# Patient Record
Sex: Female | Born: 1980 | Hispanic: Yes | Marital: Single | Smoking: Never smoker
Health system: Southern US, Community
[De-identification: ages and names within clinical notes are randomized; demographics above are authoritative.]

## PROBLEM LIST (undated history)

## (undated) ENCOUNTER — Inpatient Hospital Stay (HOSPITAL_COMMUNITY): Payer: Self-pay

## (undated) DIAGNOSIS — O24419 Gestational diabetes mellitus in pregnancy, unspecified control: Secondary | ICD-10-CM

## (undated) DIAGNOSIS — O0990 Supervision of high risk pregnancy, unspecified, unspecified trimester: Secondary | ICD-10-CM

## (undated) HISTORY — DX: Gestational diabetes mellitus in pregnancy, unspecified control: O24.419

## (undated) HISTORY — DX: Supervision of high risk pregnancy, unspecified, unspecified trimester: O09.90

---

## 2017-03-27 LAB — OB RESULTS CONSOLE VARICELLA ZOSTER ANTIBODY, IGG: Varicella: IMMUNE

## 2017-03-27 LAB — GLUCOSE TOLERANCE, 1 HOUR: GLUCOSE 1 HOUR: 92

## 2017-03-27 LAB — OB RESULTS CONSOLE HGB/HCT, BLOOD
HCT: 41
HEMOGLOBIN: 12.9

## 2017-03-27 LAB — OB RESULTS CONSOLE ABO/RH: RH Type: POSITIVE

## 2017-03-27 LAB — CULTURE, OB URINE: URINE CULTURE, OB: NEGATIVE

## 2017-03-27 LAB — OB RESULTS CONSOLE PLATELET COUNT: Platelets: 269

## 2017-03-27 LAB — CYTOLOGY - PAP: PAP SMEAR: NEGATIVE

## 2017-03-27 LAB — OB RESULTS CONSOLE ANTIBODY SCREEN: ANTIBODY SCREEN: NEGATIVE

## 2017-03-27 LAB — OB RESULTS CONSOLE RPR: RPR: NONREACTIVE

## 2017-04-12 ENCOUNTER — Encounter: Payer: Self-pay | Admitting: *Deleted

## 2017-04-18 ENCOUNTER — Encounter: Payer: Medicaid Other | Attending: Family Medicine | Admitting: *Deleted

## 2017-04-18 ENCOUNTER — Ambulatory Visit: Payer: Self-pay | Admitting: *Deleted

## 2017-04-18 DIAGNOSIS — R7302 Impaired glucose tolerance (oral): Secondary | ICD-10-CM | POA: Insufficient documentation

## 2017-04-18 DIAGNOSIS — R7309 Other abnormal glucose: Secondary | ICD-10-CM

## 2017-04-18 NOTE — Progress Notes (Signed)
  Patient was seen on 04/18/2017 for Gestational Diabetes self-management . She is here with Spanish interpretor.  Patient states history of GDM with last pregnancy a little over one year ago. She also reports she has 6 children, this is her 66th. Diet history obtained, she is eating 2 meals and 1 snack due to some nausea during the day. Beverages include coffee, water and fruit juices. She arrived 20 minutes late for this appointment so we will reschedule to cover the nutrition portion of the visit. The following learning objectives were met by the patient :   States the definition of Gestational Diabetes  States when to check blood glucose levels  Demonstrates proper blood glucose monitoring techniques  States the effect of stress and exercise on blood glucose levels  Understands that fruit juice has significant impact on BG and to avoid during pregnancy  Plan to cover at next visit:  States why dietary management is important in controlling blood glucose  Describes the effects of carbohydrates on blood glucose levels  Demonstrates ability to create a balanced meal plan  Demonstrates carbohydrate counting    States the importance of limiting caffeine and abstaining from alcohol and smoking  Plan:  Consider  increasing your activity level by walking or other activity daily as tolerated Begin checking BG before breakfast and 2 hours after first bite of breakfast, lunch and dinner as directed by MD  Bring Log Book to every medical appointment   Take medication if directed by MD  Blood glucose monitor given: [employer] Track Lot # [id number] Exp: 04/13/2019 Blood glucose reading: 110 mg/dl FASTING  Patient instructed to monitor glucose levels: FBS: 60 - 95 mg/dl 2 hour: <120 mg/dl  Patient received the following handouts:Spanish  Nutrition Diabetes and Pregnancy  Carbohydrate Counting List  Patient will be seen for follow-up in 1 week to complete education on GDM

## 2017-04-27 ENCOUNTER — Ambulatory Visit: Payer: Self-pay | Admitting: *Deleted

## 2017-04-27 ENCOUNTER — Ambulatory Visit (INDEPENDENT_AMBULATORY_CARE_PROVIDER_SITE_OTHER): Payer: Medicaid Other | Admitting: Family Medicine

## 2017-04-27 ENCOUNTER — Encounter: Payer: Medicaid Other | Attending: Family Medicine | Admitting: *Deleted

## 2017-04-27 ENCOUNTER — Encounter: Payer: Self-pay | Admitting: Family Medicine

## 2017-04-27 VITALS — BP 123/70 | HR 71 | Ht 63.0 in | Wt 170.3 lb

## 2017-04-27 DIAGNOSIS — O0990 Supervision of high risk pregnancy, unspecified, unspecified trimester: Secondary | ICD-10-CM

## 2017-04-27 DIAGNOSIS — Z713 Dietary counseling and surveillance: Secondary | ICD-10-CM | POA: Diagnosis present

## 2017-04-27 DIAGNOSIS — R7301 Impaired fasting glucose: Secondary | ICD-10-CM

## 2017-04-27 DIAGNOSIS — O24419 Gestational diabetes mellitus in pregnancy, unspecified control: Secondary | ICD-10-CM

## 2017-04-27 DIAGNOSIS — O09521 Supervision of elderly multigravida, first trimester: Secondary | ICD-10-CM | POA: Diagnosis not present

## 2017-04-27 DIAGNOSIS — O24410 Gestational diabetes mellitus in pregnancy, diet controlled: Secondary | ICD-10-CM | POA: Insufficient documentation

## 2017-04-27 DIAGNOSIS — O09529 Supervision of elderly multigravida, unspecified trimester: Secondary | ICD-10-CM

## 2017-04-27 DIAGNOSIS — R7302 Impaired glucose tolerance (oral): Secondary | ICD-10-CM | POA: Insufficient documentation

## 2017-04-27 DIAGNOSIS — O0991 Supervision of high risk pregnancy, unspecified, first trimester: Secondary | ICD-10-CM

## 2017-04-27 DIAGNOSIS — R7309 Other abnormal glucose: Secondary | ICD-10-CM

## 2017-04-27 HISTORY — DX: Gestational diabetes mellitus in pregnancy, unspecified control: O24.419

## 2017-04-27 HISTORY — DX: Supervision of high risk pregnancy, unspecified, unspecified trimester: O09.90

## 2017-04-27 LAB — POCT URINALYSIS DIP (DEVICE)
Bilirubin Urine: NEGATIVE
GLUCOSE, UA: NEGATIVE mg/dL
Hgb urine dipstick: NEGATIVE
KETONES UR: NEGATIVE mg/dL
Leukocytes, UA: NEGATIVE
Nitrite: NEGATIVE
PH: 7 (ref 5.0–8.0)
PROTEIN: NEGATIVE mg/dL
Specific Gravity, Urine: 1.015 (ref 1.005–1.030)
UROBILINOGEN UA: 0.2 mg/dL (ref 0.0–1.0)

## 2017-04-27 NOTE — Progress Notes (Signed)
  Patient was seen on 04/27/2017 for Gestational Diabetes self-management follow up visit . We used [employer] as the Spanish interpretor today. We did not have time to complete nutrition education at last visit so plan to cover that today.  Diet history obtained, she is now eating 3 meals and 2 snack due to some nausea during the day. Beverages include coffee and water . The following learning objectives were met by the patient :   States why dietary management is important in controlling blood glucose  Describes the effects of carbohydrates on blood glucose levels  Demonstrates ability to create a balanced meal plan  Demonstrates carbohydrate counting    States the importance of limiting caffeine and abstaining from alcohol and smoking  Plan:  Aim for 2-3 Carb Choices per meal, 1-2 per snack Continue checking BG before breakfast and 2 hours after first bite of breakfast, lunch and dinner as directed by MD  Bring Log Book to every medical appointment   Take medication if directed by MD  Blood glucose Log Book shows FBG all below 95 mg/dl for past week and 2 hour post meal BG all below 120 mg/dl  Patient instructed to monitor glucose levels: FBS: 60 - 95 mg/dl 2 hour: <120 mg/dl  Patient received the following handouts:Spanish  Nutrition Diabetes and Pregnancy  Carbohydrate Counting List  Patient will be seen for follow-up as needed

## 2017-04-27 NOTE — Progress Notes (Signed)
Subjective:  Rebecca Booker is a 36 y.o. G7P6006 at 3557w4d being seen today for initial prenatal care. [facility], although her 1hr GTT was 8192. She did have GDM with her last pregnancy, but was diet controlled. Additionally, HD records state that she had 2 SGA babies, although her baby in 2015 was the only SGA baby at 5#3oz at 40wks. She is currently monitored for the following issues for this high-risk pregnancy and has Supervision of high risk pregnancy, antepartum and Impaired fasting glucose on their problem list.  GDM: Patient diet controlled.  Reports no hypoglycemic episodes.  Tolerating medication well Fasting: 85-118 (5 > 90) 2hr PP: 76-138 (4 elevated)  Patient reports occasional nausea and vomiting.  Contractions: Not present. Vag. Bleeding: None.   . Denies leaking of fluid.   The following portions of the patient's history were reviewed and updated as appropriate: allergies, current medications, past family history, past medical history, past social history, past surgical history and problem list. Problem list updated.  Objective:   Vitals:   04/27/17 1534 04/27/17 1536  BP: 123/70   Pulse: 71   Weight: 170 lb 4.8 oz (77.2 kg)   Height:  5\' 3"  (1.6 m)    Fetal Status:           General:  Alert, oriented and cooperative. Patient is in no acute distress.  Skin: Skin is warm and dry. No rash noted.   Cardiovascular: Normal heart rate noted  Respiratory: Normal respiratory effort, no problems with respiration noted  Abdomen: Soft, gravid, appropriate for gestational age. Pain/Pressure: Absent     Pelvic: Vag. Bleeding: None     Cervical exam deferred        Extremities: Normal range of motion.  Edema: None  Mental Status: Normal mood and affect. Normal behavior. Normal judgment and thought content.   Urinalysis: Urine Protein: Negative Urine Glucose: Negative  Assessment and Plan:  Pregnancy: G7P6006 at 7757w4d  1.  Supervision of high risk pregnancy, antepartum FHT and FH normal. Detailed US  2. Impaired fasting glucose Although her 1hr GTT was normal, she had several abnormal values on her fasting and PP checks. Will continue with CBGs and will get HgA1c. - Hemoglobin A1c  3. AMA (advanced maternal age) multigravida 35+, unspecified trimester Offer down syndrome screening - pt declined.  Preterm labor symptoms and general obstetric precautions including but not limited to vaginal bleeding, contractions, leaking of fluid and fetal movement were reviewed in detail with the patient. Please refer to After Visit Summary for other counseling recommendations.  No Follow-up on file.   Levie HeritageStinson, Marbeth Smedley J, DO

## 2017-04-27 NOTE — Progress Notes (Signed)
Spanish Interpreter Marly Adams Flu vaccine today

## 2017-04-28 LAB — HEMOGLOBIN A1C
Est. average glucose Bld gHb Est-mCnc: 108 mg/dL
Hgb A1c MFr Bld: 5.4 % (ref 4.8–5.6)

## 2017-05-25 ENCOUNTER — Encounter: Payer: Self-pay | Admitting: Obstetrics and Gynecology

## 2017-05-30 NOTE — L&D Delivery Note (Addendum)
Patient: Rebecca Booker MRN: [id number]  GBS status: Positive, Ampicillin   Patient is a 37 y.o. now G7P7 s/p NSVD at [redacted]w[redacted]d, who was admitted for SOL. SROM 0h 43m prior to delivery with clear fluid.   Delivery Note At 11:29 PM a viable female was delivered via Vaginal, Spontaneous (Presentation: ROA;  vertex).  APGAR: 9, 9  Placenta status: intact, sent to L&D.  Cord: 3-vessel  with no complications  Anesthesia:  None Episiotomy: None Lacerations: None Suture Repair: None Est. Blood Loss (mL): 50  Mom to postpartum.  Baby to Couplet care / Skin to Skin.  Head delivered ROA. No nuchal cord present. Shoulder and body delivered in usual fashion. Infant with spontaneous cry, placed on mother's abdomen, dried and bulb suctioned. Cord clamped x 2 after 1-minute delay, and cut by RN. Cord blood drawn. Placenta delivered spontaneously with gentle cord traction. Fundus firm with massage and Pitocin. Perineum inspected and found to have no lacerations.  Patient with good hemostasis and minimal blood loss.      Alfonso Ellis 10/21/2017, 11:43 PM

## 2017-06-14 ENCOUNTER — Encounter (HOSPITAL_COMMUNITY): Payer: Self-pay

## 2017-06-14 ENCOUNTER — Inpatient Hospital Stay (HOSPITAL_COMMUNITY)
Admission: AD | Admit: 2017-06-14 | Discharge: 2017-06-14 | Disposition: A | Discharge status: Home / Self Care | Payer: No Typology Code available for payment source | Source: Ambulatory Visit | Attending: Obstetrics and Gynecology | Admitting: Obstetrics and Gynecology

## 2017-06-14 DIAGNOSIS — Z3A20 20 weeks gestation of pregnancy: Secondary | ICD-10-CM | POA: Diagnosis not present

## 2017-06-14 DIAGNOSIS — O09529 Supervision of elderly multigravida, unspecified trimester: Secondary | ICD-10-CM

## 2017-06-14 DIAGNOSIS — Z363 Encounter for antenatal screening for malformations: Secondary | ICD-10-CM

## 2017-06-14 DIAGNOSIS — O26893 Other specified pregnancy related conditions, third trimester: Secondary | ICD-10-CM | POA: Diagnosis present

## 2017-06-14 DIAGNOSIS — M25519 Pain in unspecified shoulder: Secondary | ICD-10-CM | POA: Insufficient documentation

## 2017-06-14 DIAGNOSIS — R079 Chest pain, unspecified: Secondary | ICD-10-CM | POA: Diagnosis not present

## 2017-06-14 DIAGNOSIS — O09522 Supervision of elderly multigravida, second trimester: Secondary | ICD-10-CM | POA: Insufficient documentation

## 2017-06-14 DIAGNOSIS — O0990 Supervision of high risk pregnancy, unspecified, unspecified trimester: Secondary | ICD-10-CM

## 2017-06-14 DIAGNOSIS — O9A212 Injury, poisoning and certain other consequences of external causes complicating pregnancy, second trimester: Secondary | ICD-10-CM

## 2017-06-14 DIAGNOSIS — Y92410 Unspecified street and highway as the place of occurrence of the external cause: Secondary | ICD-10-CM | POA: Diagnosis not present

## 2017-06-14 DIAGNOSIS — O24410 Gestational diabetes mellitus in pregnancy, diet controlled: Secondary | ICD-10-CM | POA: Diagnosis not present

## 2017-06-14 DIAGNOSIS — Z3A21 21 weeks gestation of pregnancy: Secondary | ICD-10-CM

## 2017-06-14 DIAGNOSIS — Z1389 Encounter for screening for other disorder: Secondary | ICD-10-CM

## 2017-06-14 LAB — URINALYSIS, ROUTINE W REFLEX MICROSCOPIC
BILIRUBIN URINE: NEGATIVE
GLUCOSE, UA: NEGATIVE mg/dL
HGB URINE DIPSTICK: NEGATIVE
KETONES UR: NEGATIVE mg/dL
Leukocytes, UA: NEGATIVE
Nitrite: NEGATIVE
PH: 6 (ref 5.0–8.0)
Protein, ur: NEGATIVE mg/dL
Specific Gravity, Urine: 1.01 (ref 1.005–1.030)
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
## 2017-06-22 ENCOUNTER — Ambulatory Visit (HOSPITAL_COMMUNITY)
Admit: 2017-06-22 | Discharge: 2017-06-22 | Disposition: A | Discharge status: Home / Self Care | Payer: Self-pay | Attending: Student | Admitting: Student

## 2017-06-22 ENCOUNTER — Ambulatory Visit (HOSPITAL_COMMUNITY)
Admit: 2017-06-22 | Discharge: 2017-06-22 | Disposition: A | Discharge status: Home / Self Care | Payer: Self-pay | Source: Ambulatory Visit | Attending: Student | Admitting: Student

## 2017-06-22 ENCOUNTER — Ambulatory Visit (INDEPENDENT_AMBULATORY_CARE_PROVIDER_SITE_OTHER): Payer: Self-pay | Admitting: Family Medicine

## 2017-06-22 ENCOUNTER — Encounter (HOSPITAL_COMMUNITY): Payer: Self-pay

## 2017-06-22 VITALS — BP 116/61 | HR 84 | Wt 174.3 lb

## 2017-06-22 DIAGNOSIS — Z3A21 21 weeks gestation of pregnancy: Secondary | ICD-10-CM | POA: Insufficient documentation

## 2017-06-22 DIAGNOSIS — O09522 Supervision of elderly multigravida, second trimester: Secondary | ICD-10-CM | POA: Insufficient documentation

## 2017-06-22 DIAGNOSIS — O09529 Supervision of elderly multigravida, unspecified trimester: Secondary | ICD-10-CM

## 2017-06-22 DIAGNOSIS — O0990 Supervision of high risk pregnancy, unspecified, unspecified trimester: Secondary | ICD-10-CM

## 2017-06-22 DIAGNOSIS — Z363 Encounter for antenatal screening for malformations: Secondary | ICD-10-CM | POA: Insufficient documentation

## 2017-06-22 DIAGNOSIS — O24410 Gestational diabetes mellitus in pregnancy, diet controlled: Secondary | ICD-10-CM | POA: Insufficient documentation

## 2017-06-22 DIAGNOSIS — O0992 Supervision of high risk pregnancy, unspecified, second trimester: Secondary | ICD-10-CM | POA: Insufficient documentation

## 2017-06-22 LAB — POCT URINALYSIS DIP (DEVICE)
Bilirubin Urine: NEGATIVE
GLUCOSE, UA: NEGATIVE mg/dL
Hgb urine dipstick: NEGATIVE
KETONES UR: NEGATIVE mg/dL
Leukocytes, UA: NEGATIVE
Nitrite: NEGATIVE
Protein, ur: NEGATIVE mg/dL
SPECIFIC GRAVITY, URINE: 1.015 (ref 1.005–1.030)
Urobilinogen, UA: 0.2 mg/dL (ref 0.0–1.0)
pH: 7 (ref 5.0–8.0)

## 2017-06-22 NOTE — Progress Notes (Signed)
Subjective:  Rebecca Booker is a 37 y.o. G7P6006 at 4861w4d being seen today for ongoing prenatal care.  She is currently monitored for the following issues for this high-risk pregnancy and has Supervision of high risk pregnancy, antepartum; GDM, class A2; and AMA (advanced maternal age) multigravida 35+, unspecified trimester on their problem list.  GDM: Patient diet controlled. Ran out of strips, so not checking after every meal. Also checking some before meals.  Fasting: 76-100 (3 elevated over 2 weeks) 2hr PP: 93-135 (4-5 elevated)  Patient reports no complaints.  Contractions: Not present. Vag. Bleeding: None.  Movement: Present. Denies leaking of fluid.   The following portions of the patient's history were reviewed and updated as appropriate: allergies, current medications, past family history, past medical history, past social history, past surgical history and problem list. Problem list updated.  Objective:   Vitals:   06/22/17 1325  BP: 116/61  Pulse: 84  Weight: 174 lb 4.8 oz (79.1 kg)    Fetal Status: Fetal Heart Rate (bpm): 150   Movement: Present     General:  Alert, oriented and cooperative. Patient is in no acute distress.  Skin: Skin is warm and dry. No rash noted.   Cardiovascular: Normal heart rate noted  Respiratory: Normal respiratory effort, no problems with respiration noted  Abdomen: Soft, gravid, appropriate for gestational age. Pain/Pressure: Absent     Pelvic: Vag. Bleeding: None     Cervical exam deferred        Extremities: Normal range of motion.  Edema: Trace  Mental Status: Normal mood and affect. Normal behavior. Normal judgment and thought content.   Urinalysis:      Assessment and Plan:  Pregnancy: G7P6006 at 3261w4d  1. Supervision of high risk pregnancy, antepartum FHT and FH normal  2. AMA (advanced maternal age) multigravida 35+, unspecified trimester  3. GDM A1 Reemphasized taking CBGs fasting and 2hrs PP. Discussed compliance  with carb modified diet.  Preterm labor symptoms and general obstetric precautions including but not limited to vaginal bleeding, contractions, leaking of fluid and fetal movement were reviewed in detail with the patient. Please refer to After Visit Summary for other counseling recommendations.  No Follow-up on file.   Levie HeritageStinson, Braheem Tomasik J, DO

## 2017-06-22 NOTE — Progress Notes (Signed)
Contacted US to inform of tardiness.

## 2017-06-23 DIAGNOSIS — Z3A21 21 weeks gestation of pregnancy: Secondary | ICD-10-CM | POA: Insufficient documentation

## 2017-06-23 NOTE — Progress Notes (Signed)
Genetic Counseling  High-Risk Gestation Note  Appointment Date:  06/23/2017 Referred By: Judeth Horn, NP Date of Birth:  [birth year]   Pregnancy History: [id number] Estimated Date of Delivery: 10/29/17 Estimated Gestational Age: [redacted]w[redacted]d Attending: Particia Nearing, MD   Ms. Rebecca Booker was seen for genetic counseling because of a maternal age of 37 y.o.. She will be 37 years old at delivery. [facility] English/Spanish interpreter, Rebecca Booker, provided interpretation for today's visit.      In summary:  Discussed AMA and associated risk for fetal aneuploidy  Discussed options for screening  NIPS- patient wants to discuss cost with her husband and if desires to pursue, will plan for lab draw on 07/20/17  Ultrasound- performed today, see separate report  Discussed diagnostic testing options  Amniocentesis- declined  Reviewed family history concerns  She was counseled regarding maternal age and the association with risk for chromosome conditions due to nondisjunction with aging of the ova.   We reviewed chromosomes, nondisjunction, and the associated 1 in 29 risk for fetal aneuploidy at [redacted]w[redacted]d gestation related to a maternal age of 37 y.o. at delivery.  She was counseled that the risk for aneuploidy decreases as gestational age increases, accounting for those pregnancies which spontaneously abort.  We specifically discussed Down syndrome (trisomy 54), trisomies 44 and 71, and sex chromosome aneuploidies (47,XXX and 47,XXY) including the common features and prognoses of each.   We reviewed available screening options including noninvasive prenatal screening (NIPS)/cell free DNA (cfDNA) screening and detailed ultrasound.  She was counseled that screening tests are used to modify a patient's a priori risk for aneuploidy, typically based on age. This estimate provides a pregnancy specific risk assessment. We reviewed the benefits and limitations of each option. Specifically, we discussed the  conditions for which each test screens, the detection rates, and false positive rates of each. She was also counseled regarding diagnostic testing via amniocentesis. We reviewed the approximate 1 in 300-500 risk for complications from amniocentesis, including spontaneous pregnancy loss. We discussed the possible results that the tests might provide including: positive, negative, unanticipated, and no result. Finally, they were counseled regarding the cost of each option and potential out of pocket expenses. The patient is self-pay.   She expressed interest in NIPS. We specifically discussed the self-pay price and the option to apply for compassionate care through [facility] laboratory that performs Panorama, and the potential associated reduction of cost. The patient declined pursuing NIPS today and plans to discuss this option further with her husband given the out of pocket cost. If she elects to pursue NIPS, she will plan for lab draw in our office to coincide with the date of her next OB visit. Lab draw for NIPS is scheduled for 07/20/17. The patient understands that this lab draw can be cancelled if she elects not to pursue NIPS. She declined amniocentesis given the associated risk for complications.     A complete ultrasound was performed today. The ultrasound report will be sent under separate cover. There were no visualized fetal anomalies or markers suggestive of aneuploidy. She understands that screening tests cannot rule out all birth defects or genetic syndromes. The patient was advised of this limitation and states she still does not want additional testing at this time.   Ms. Rebecca Booker was provided with written information regarding cystic fibrosis (CF), spinal muscular atrophy (SMA) and hemoglobinopathies including the carrier frequency, availability of carrier screening and prenatal diagnosis if indicated.  In addition, we discussed that CF and hemoglobinopathies  are routinely screened  for as part of the [place] newborn screening panel.  After further discussion, she elected to pursue / declined screening for CF, SMA and hemoglobinopathies.  Detailed family histories were not able to be constructed and reviewed today given time constraints due to the patient arriving late to today's visits. She reported that there was no known history of birth defects, intellectual disability, or known genetic conditions for herself of the father of the pregnancy. Consanguinity was denied. Without further information regarding the provided family history, an accurate genetic risk cannot be calculated. Further genetic counseling is warranted if more information is obtained.  Ms. Rebecca Booker denied exposure to environmental toxins or chemical agents. She denied the use of alcohol, tobacco or street drugs. She denied significant viral illnesses during the course of her pregnancy.   I counseled Ms. Rebecca ClassAna Guzman Booker regarding the above risks and available options.  The approximate face-to-face time with the genetic counselor was 30 minutes.  Quinn PlowmanKaren Rishi Vicario, MS,  Patent attorneyCertified Genetic Counselor

## 2017-07-20 ENCOUNTER — Ambulatory Visit (HOSPITAL_COMMUNITY): Payer: No Typology Code available for payment source

## 2017-07-20 ENCOUNTER — Ambulatory Visit (INDEPENDENT_AMBULATORY_CARE_PROVIDER_SITE_OTHER): Payer: Self-pay | Admitting: Obstetrics & Gynecology

## 2017-07-20 VITALS — BP 122/75 | HR 84 | Wt 177.1 lb

## 2017-07-20 DIAGNOSIS — O09529 Supervision of elderly multigravida, unspecified trimester: Secondary | ICD-10-CM

## 2017-07-20 DIAGNOSIS — O0990 Supervision of high risk pregnancy, unspecified, unspecified trimester: Secondary | ICD-10-CM

## 2017-07-20 DIAGNOSIS — O24419 Gestational diabetes mellitus in pregnancy, unspecified control: Secondary | ICD-10-CM

## 2017-07-20 NOTE — Progress Notes (Signed)
   PRENATAL VISIT NOTE  Subjective:  Rebecca Booker is a 37 y.o. G7P6006 at 5148w4d being seen today for ongoing prenatal care.  She is currently monitored for the following issues for this high-risk pregnancy and has Supervision of high risk pregnancy, antepartum; GDM, class A2; and AMA (advanced maternal age) multigravida 35+, unspecified trimester on their problem list.  Patient reports no complaints.  Contractions: Not present. Vag. Bleeding: None.  Movement: Present. Denies leaking of fluid.   The following portions of the patient's history were reviewed and updated as appropriate: allergies, current medications, past family history, past medical history, past social history, past surgical history and problem list. Problem list updated.  Objective:   Vitals:   07/20/17 1352  BP: 122/75  Pulse: 84  Weight: 177 lb 1.6 oz (80.3 kg)    Fetal Status: Fetal Heart Rate (bpm): 135   Movement: Present     General:  Alert, oriented and cooperative. Patient is in no acute distress.  Skin: Skin is warm and dry. No rash noted.   Cardiovascular: Normal heart rate noted  Respiratory: Normal respiratory effort, no problems with respiration noted  Abdomen: Soft, gravid, appropriate for gestational age.  Pain/Pressure: Absent     Pelvic: Cervical exam deferred        Extremities: Normal range of motion.  Edema: None  Mental Status:  Normal mood and affect. Normal behavior. Normal judgment and thought content.   Assessment and Plan:  Pregnancy: G7P6006 at 5448w4d  1. AMA (advanced maternal age) multigravida 35+, unspecified trimester Does not want Panorama or any genetic testing.    2. GDM, class A2 3 elevated fastings the next week. A couple of elevated post prandials.  Pt will decrease tortillas.  Will see in 2 weeks and see how CBGs are going.  MFM growth in 3 weeks.  3. Supervision of high risk pregnancy, antepartum RN Zachery DauerBarnes will abstract the prenatal records from health dept and  fill in box on problem list.     Preterm labor symptoms and general obstetric precautions including but not limited to vaginal bleeding, contractions, leaking of fluid and fetal movement were reviewed in detail with the patient. Please refer to After Visit Summary for other counseling recommendations.   RTC 2 weeks  Elsie LincolnKelly Jayen Bromwell, MD

## 2017-07-21 LAB — POCT URINALYSIS DIP (DEVICE)
Bilirubin Urine: NEGATIVE
Glucose, UA: NEGATIVE mg/dL
Hgb urine dipstick: NEGATIVE
Ketones, ur: NEGATIVE mg/dL
Leukocytes, UA: NEGATIVE
NITRITE: NEGATIVE
PH: 7 (ref 5.0–8.0)
PROTEIN: NEGATIVE mg/dL
Specific Gravity, Urine: 1.01 (ref 1.005–1.030)
UROBILINOGEN UA: 0.2 mg/dL (ref 0.0–1.0)

## 2017-08-03 ENCOUNTER — Ambulatory Visit (INDEPENDENT_AMBULATORY_CARE_PROVIDER_SITE_OTHER): Payer: Self-pay | Admitting: Obstetrics and Gynecology

## 2017-08-03 ENCOUNTER — Encounter: Payer: Self-pay | Admitting: Obstetrics and Gynecology

## 2017-08-03 VITALS — BP 116/58 | HR 79 | Wt 177.5 lb

## 2017-08-03 DIAGNOSIS — O24419 Gestational diabetes mellitus in pregnancy, unspecified control: Secondary | ICD-10-CM

## 2017-08-03 DIAGNOSIS — O09529 Supervision of elderly multigravida, unspecified trimester: Secondary | ICD-10-CM

## 2017-08-03 DIAGNOSIS — O0990 Supervision of high risk pregnancy, unspecified, unspecified trimester: Secondary | ICD-10-CM

## 2017-08-03 LAB — POCT URINALYSIS DIP (DEVICE)
BILIRUBIN URINE: NEGATIVE
Glucose, UA: NEGATIVE mg/dL
HGB URINE DIPSTICK: NEGATIVE
Ketones, ur: NEGATIVE mg/dL
Nitrite: NEGATIVE
Protein, ur: NEGATIVE mg/dL
Specific Gravity, Urine: 1.02 (ref 1.005–1.030)
UROBILINOGEN UA: 0.2 mg/dL (ref 0.0–1.0)
pH: 6.5 (ref 5.0–8.0)

## 2017-08-03 MED ORDER — PHENYLEPHRINE-MINERAL OIL-PET 0.25-14-74.9 % RE OINT: 1.0000 "application " | TOPICAL_OINTMENT | Freq: Two times a day (BID) | RECTAL | 3 refills | Status: DC | PRN

## 2017-08-03 NOTE — Progress Notes (Signed)
Pt c/o external itching around the anus

## 2017-08-03 NOTE — Progress Notes (Signed)
   PRENATAL VISIT NOTE  Subjective:  Rebecca Booker is a 37 y.o. G7P6006 at 5336w4d being seen today for ongoing prenatal care.  She is currently monitored for the following issues for this high-risk pregnancy and has Supervision of high risk pregnancy, antepartum; GDM, class A2; and AMA (advanced maternal age) multigravida 35+, unspecified trimester on their problem list.  Patient reports pruritic hemorrhoids which she treated with vinager.  Contractions: Not present. Vag. Bleeding: None.  Movement: Absent. Denies leaking of fluid.   The following portions of the patient's history were reviewed and updated as appropriate: allergies, current medications, past family history, past medical history, past social history, past surgical history and problem list. Problem list updated.  Objective:   Vitals:   08/03/17 1347  BP: (!) 116/58  Pulse: 79  Weight: 177 lb 8 oz (80.5 kg)    Fetal Status: Fetal Heart Rate (bpm): 145 Fundal Height: 28 cm Movement: Absent     General:  Alert, oriented and cooperative. Patient is in no acute distress.  Skin: Skin is warm and dry. No rash noted.   Cardiovascular: Normal heart rate noted  Respiratory: Normal respiratory effort, no problems with respiration noted  Abdomen: Soft, gravid, appropriate for gestational age.  Pain/Pressure: Absent     Pelvic: Cervical exam deferred      Small non tender hemorrhoid approximately 5 mm  Extremities: Normal range of motion.     Mental Status:  Normal mood and affect. Normal behavior. Normal judgment and thought content.   Assessment and Plan:  Pregnancy: G7P6006 at 3436w4d  1. Supervision of high risk pregnancy, antepartum Patient is doing well Rx preparation H provided Third trimester labs today - CBC - HIV antibody - RPR  2. GDM, class A2 CBGs reviewed and postprandial within range. 50 % of fasting within range. Patient states that she drinks a cup of coffee with sugar in the morning before fasting  values are obtained. Values range 84-110. Educated patient of the pertinence of fasting CBG. Also advised patient to consume protein rich snacks in between meal Will continue with diet control. Discussed starting insulin if no improvement  3. AMA (advanced maternal age) multigravida 35+, unspecified trimester Declined testing  Preterm labor symptoms and general obstetric precautions including but not limited to vaginal bleeding, contractions, leaking of fluid and fetal movement were reviewed in detail with the patient. Please refer to After Visit Summary for other counseling recommendations.  No Follow-up on file.   Catalina AntiguaPeggy Celisa Schoenberg, MD

## 2017-08-04 LAB — CBC
Hematocrit: 32.7 % — ABNORMAL LOW (ref 34.0–46.6)
Hemoglobin: 10.7 g/dL — ABNORMAL LOW (ref 11.1–15.9)
MCH: 27.9 pg (ref 26.6–33.0)
MCHC: 32.7 g/dL (ref 31.5–35.7)
MCV: 85 fL (ref 79–97)
PLATELETS: 215 10*3/uL (ref 150–379)
RBC: 3.84 x10E6/uL (ref 3.77–5.28)
RDW: 14.9 % (ref 12.3–15.4)
WBC: 9.6 10*3/uL (ref 3.4–10.8)

## 2017-08-04 LAB — HIV ANTIBODY (ROUTINE TESTING W REFLEX): HIV SCREEN 4TH GENERATION: NONREACTIVE

## 2017-08-04 LAB — RPR: RPR Ser Ql: NONREACTIVE

## 2017-08-16 ENCOUNTER — Other Ambulatory Visit: Payer: Self-pay | Admitting: Obstetrics & Gynecology

## 2017-08-16 ENCOUNTER — Encounter: Payer: Self-pay | Admitting: Obstetrics & Gynecology

## 2017-08-16 ENCOUNTER — Ambulatory Visit (HOSPITAL_COMMUNITY)
Admission: RE | Admit: 2017-08-16 | Discharge: 2017-08-16 | Disposition: A | Discharge status: Home / Self Care | Payer: Self-pay | Source: Ambulatory Visit | Attending: Obstetrics & Gynecology | Admitting: Obstetrics & Gynecology

## 2017-08-16 ENCOUNTER — Ambulatory Visit (INDEPENDENT_AMBULATORY_CARE_PROVIDER_SITE_OTHER): Payer: Self-pay | Admitting: Obstetrics & Gynecology

## 2017-08-16 VITALS — BP 115/62 | HR 89 | Wt 177.3 lb

## 2017-08-16 DIAGNOSIS — O322XX0 Maternal care for transverse and oblique lie, not applicable or unspecified: Secondary | ICD-10-CM | POA: Insufficient documentation

## 2017-08-16 DIAGNOSIS — O09522 Supervision of elderly multigravida, second trimester: Secondary | ICD-10-CM

## 2017-08-16 DIAGNOSIS — Z3A29 29 weeks gestation of pregnancy: Secondary | ICD-10-CM | POA: Insufficient documentation

## 2017-08-16 DIAGNOSIS — Z362 Encounter for other antenatal screening follow-up: Secondary | ICD-10-CM

## 2017-08-16 DIAGNOSIS — O0990 Supervision of high risk pregnancy, unspecified, unspecified trimester: Secondary | ICD-10-CM

## 2017-08-16 DIAGNOSIS — O0993 Supervision of high risk pregnancy, unspecified, third trimester: Secondary | ICD-10-CM

## 2017-08-16 DIAGNOSIS — O24419 Gestational diabetes mellitus in pregnancy, unspecified control: Secondary | ICD-10-CM

## 2017-08-16 DIAGNOSIS — O09299 Supervision of pregnancy with other poor reproductive or obstetric history, unspecified trimester: Secondary | ICD-10-CM

## 2017-08-16 DIAGNOSIS — O09523 Supervision of elderly multigravida, third trimester: Secondary | ICD-10-CM

## 2017-08-16 DIAGNOSIS — Z23 Encounter for immunization: Secondary | ICD-10-CM

## 2017-08-16 DIAGNOSIS — O09529 Supervision of elderly multigravida, unspecified trimester: Secondary | ICD-10-CM

## 2017-08-16 DIAGNOSIS — O09293 Supervision of pregnancy with other poor reproductive or obstetric history, third trimester: Secondary | ICD-10-CM | POA: Insufficient documentation

## 2017-08-16 LAB — POCT URINALYSIS DIP (DEVICE)
Bilirubin Urine: NEGATIVE
GLUCOSE, UA: NEGATIVE mg/dL
Hgb urine dipstick: NEGATIVE
Ketones, ur: NEGATIVE mg/dL
LEUKOCYTES UA: NEGATIVE
NITRITE: NEGATIVE
PROTEIN: NEGATIVE mg/dL
Specific Gravity, Urine: 1.015 (ref 1.005–1.030)
Urobilinogen, UA: 0.2 mg/dL (ref 0.0–1.0)
pH: 6.5 (ref 5.0–8.0)

## 2017-08-16 NOTE — Patient Instructions (Addendum)

## 2017-08-16 NOTE — Progress Notes (Signed)
   PRENATAL VISIT NOTE  Subjective:  Rebecca Booker is a 37 y.o. G7P6006 at 5363w3d being seen today for ongoing prenatal care.  She is currently monitored for the following issues for this high-risk pregnancy and has Supervision of high risk pregnancy, antepartum; GDM, class A2; and AMA (advanced maternal age) multigravida 35+, unspecified trimester on their problem list.  Patient reports no complaints.  Contractions: Not present. Vag. Bleeding: None.  Movement: Present. Denies leaking of fluid.   The following portions of the patient's history were reviewed and updated as appropriate: allergies, current medications, past family history, past medical history, past social history, past surgical history and problem list. Problem list updated.  Objective:   Vitals:   08/16/17 1337  BP: 115/62  Pulse: 89  Weight: 177 lb 4.8 oz (80.4 kg)    Fetal Status: Fetal Heart Rate (bpm): 143 Fundal Height: 32 cm Movement: Present     General:  Alert, oriented and cooperative. Patient is in no acute distress.  Skin: Skin is warm and dry. No rash noted.   Cardiovascular: Normal heart rate noted  Respiratory: Normal respiratory effort, no problems with respiration noted  Abdomen: Soft, gravid, appropriate for gestational age.  Pain/Pressure: Present     Pelvic: Cervical exam deferred        Extremities: Normal range of motion.  Edema: None  Mental Status:  Normal mood and affect. Normal behavior. Normal judgment and thought content.   Assessment and Plan:  Pregnancy: G7P6006 at 1363w3d  1. Supervision of high risk pregnancy, antepartum  - Flu Vaccine QUAD 36+ mos IM  2. GDM, class A2  - Flu Vaccine QUAD 36+ mos IM  3. AMA (advanced maternal age) multigravida 35+, unspecified trimester  - Flu Vaccine QUAD 36+ mos IM She is not sure all FBS are truly fasting, most are above 95 and majority of PP are in range.  Preterm labor symptoms and general obstetric precautions including but not  limited to vaginal bleeding, contractions, leaking of fluid and fetal movement were reviewed in detail with the patient. Please refer to After Visit Summary for other counseling recommendations.  Return in about 2 weeks (around 08/30/2017). F/u US is today  Scheryl DarterJames Laniece Hornbaker, MD

## 2017-08-17 ENCOUNTER — Ambulatory Visit (HOSPITAL_COMMUNITY): Payer: Self-pay

## 2017-08-25 ENCOUNTER — Ambulatory Visit (INDEPENDENT_AMBULATORY_CARE_PROVIDER_SITE_OTHER): Payer: Self-pay | Admitting: Obstetrics and Gynecology

## 2017-08-25 ENCOUNTER — Encounter: Payer: Self-pay | Admitting: Obstetrics and Gynecology

## 2017-08-25 VITALS — BP 116/68 | HR 85 | Wt 176.6 lb

## 2017-08-25 DIAGNOSIS — O09529 Supervision of elderly multigravida, unspecified trimester: Secondary | ICD-10-CM

## 2017-08-25 DIAGNOSIS — O24410 Gestational diabetes mellitus in pregnancy, diet controlled: Secondary | ICD-10-CM

## 2017-08-25 DIAGNOSIS — O0990 Supervision of high risk pregnancy, unspecified, unspecified trimester: Secondary | ICD-10-CM

## 2017-08-25 DIAGNOSIS — Z23 Encounter for immunization: Secondary | ICD-10-CM

## 2017-08-25 DIAGNOSIS — O09523 Supervision of elderly multigravida, third trimester: Secondary | ICD-10-CM

## 2017-08-25 DIAGNOSIS — O0993 Supervision of high risk pregnancy, unspecified, third trimester: Secondary | ICD-10-CM

## 2017-08-25 LAB — POCT URINALYSIS DIP (DEVICE)
Bilirubin Urine: NEGATIVE
Glucose, UA: NEGATIVE mg/dL
HGB URINE DIPSTICK: NEGATIVE
Ketones, ur: NEGATIVE mg/dL
Nitrite: NEGATIVE
Protein, ur: NEGATIVE mg/dL
SPECIFIC GRAVITY, URINE: 1.02 (ref 1.005–1.030)
UROBILINOGEN UA: 0.2 mg/dL (ref 0.0–1.0)
pH: 6.5 (ref 5.0–8.0)

## 2017-08-25 NOTE — Progress Notes (Signed)
Subjective:  Rebecca Booker is a 37 y.o. G7P6006 at 5565w5d being seen today for ongoing prenatal care.  She is currently monitored for the following issues for this high-risk pregnancy and has Supervision of high risk pregnancy, antepartum; DM (diabetes mellitus), gestational; and AMA (advanced maternal age) multigravida 35+, unspecified trimester on their problem list.  Patient reports no complaints.  Contractions: Irritability. Vag. Bleeding: None.  Movement: Present. Denies leaking of fluid.   The following portions of the patient's history were reviewed and updated as appropriate: allergies, current medications, past family history, past medical history, past social history, past surgical history and problem list. Problem list updated.  Objective:   Vitals:   08/25/17 0911  BP: 116/68  Pulse: 85  Weight: 176 lb 9.6 oz (80.1 kg)    Fetal Status: Fetal Heart Rate (bpm): 125   Movement: Present     General:  Alert, oriented and cooperative. Patient is in no acute distress.  Skin: Skin is warm and dry. No rash noted.   Cardiovascular: Normal heart rate noted  Respiratory: Normal respiratory effort, no problems with respiration noted  Abdomen: Soft, gravid, appropriate for gestational age. Pain/Pressure: Present     Pelvic:  Cervical exam deferred        Extremities: Normal range of motion.  Edema: None  Mental Status: Normal mood and affect. Normal behavior. Normal judgment and thought content.   Urinalysis:      Assessment and Plan:  Pregnancy: G7P6006 at 4565w5d  1. AMA (advanced maternal age) multigravida 35+, unspecified trimester   2. Diet controlled gestational diabetes mellitus (GDM) in third trimester CBG in goal range for most part Outliers related to diet choices Diet reviewed - US MFM OB FOLLOW UP; Future  3. Supervision of high risk pregnancy, antepartum Stable  Preterm labor symptoms and general obstetric precautions including but not limited to vaginal  bleeding, contractions, leaking of fluid and fetal movement were reviewed in detail with the patient. Please refer to After Visit Summary for other counseling recommendations.  Return in about 2 weeks (around 09/08/2017) for OB visit.   Hermina StaggersErvin, Michael L, MD

## 2017-08-25 NOTE — Progress Notes (Signed)
Spanish interpreter Erika present for visit 

## 2017-08-25 NOTE — Addendum Note (Signed)
Addended by: Garret ReddishBARNES, Ymani Porcher M on: 08/25/2017 11:35 AM   Modules accepted: Orders

## 2017-09-11 ENCOUNTER — Ambulatory Visit (INDEPENDENT_AMBULATORY_CARE_PROVIDER_SITE_OTHER): Payer: Self-pay | Admitting: Obstetrics & Gynecology

## 2017-09-11 VITALS — BP 118/66 | HR 87 | Wt 178.2 lb

## 2017-09-11 DIAGNOSIS — O24410 Gestational diabetes mellitus in pregnancy, diet controlled: Secondary | ICD-10-CM

## 2017-09-11 DIAGNOSIS — O0990 Supervision of high risk pregnancy, unspecified, unspecified trimester: Secondary | ICD-10-CM

## 2017-09-11 DIAGNOSIS — N938 Other specified abnormal uterine and vaginal bleeding: Secondary | ICD-10-CM

## 2017-09-11 NOTE — Patient Instructions (Signed)

## 2017-09-11 NOTE — Progress Notes (Signed)
   PRENATAL VISIT NOTE  Subjective:  Josefine Classna Guzman [name] is a 37 y.o. G7P6006 at 7869w1d being seen today for ongoing prenatal care.  She is currently monitored for the following issues for this high-risk pregnancy and has Supervision of high risk pregnancy, antepartum; DM (diabetes mellitus), gestational; and AMA (advanced maternal age) multigravida 35+, unspecified trimester on their problem list.  Patient reports no complaints.  Contractions: Irritability. Vag. Bleeding: None.  Movement: Present. Denies leaking of fluid.   The following portions of the patient's history were reviewed and updated as appropriate: allergies, current medications, past family history, past medical history, past social history, past surgical history and problem list. Problem list updated.  Objective:   Vitals:   09/11/17 1544  BP: 118/66  Pulse: 87  Weight: 178 lb 3.2 oz (80.8 kg)    Fetal Status: Fetal Heart Rate (bpm): 136 Fundal Height: 34 cm Movement: Present     General:  Alert, oriented and cooperative. Patient is in no acute distress.  Skin: Skin is warm and dry. No rash noted.   Cardiovascular: Normal heart rate noted  Respiratory: Normal respiratory effort, no problems with respiration noted  Abdomen: Soft, gravid, appropriate for gestational age.  Pain/Pressure: Present     Pelvic: Cervical exam deferred        Extremities: Normal range of motion.  Edema: Trace  Mental Status: Normal mood and affect. Normal behavior. Normal judgment and thought content.   Assessment and Plan:  Pregnancy: G7P6006 at 7469w1d  1. Diet controlled gestational diabetes mellitus (GDM) in third trimester FBS >80% <95, PP almost all <120  2. Supervision of high risk pregnancy, antepartum F/U US at 38 week  Preterm labor symptoms and general obstetric precautions including but not limited to vaginal bleeding, contractions, leaking of fluid and fetal movement were reviewed in detail with the patient. Please refer to  After Visit Summary for other counseling recommendations.  Return in about 2 weeks (around 09/25/2017).  No future appointments.  Scheryl DarterJames Kerisha Goughnour, MD

## 2017-09-11 NOTE — Progress Notes (Signed)
US scheduled for April 22nd @ 1000.  Pt notified.

## 2017-09-12 ENCOUNTER — Encounter: Payer: Self-pay | Admitting: Obstetrics and Gynecology

## 2017-09-18 ENCOUNTER — Ambulatory Visit (HOSPITAL_COMMUNITY): Admission: RE | Admit: 2017-09-18 | Payer: Self-pay | Source: Ambulatory Visit

## 2017-09-18 ENCOUNTER — Other Ambulatory Visit: Payer: Self-pay

## 2017-09-18 NOTE — Progress Notes (Signed)
Scheduled OB US for May 20th @ 1000.  Pt will be notified at her next appt.

## 2017-09-18 NOTE — Addendum Note (Signed)
Addended by: Faythe CasaBELLAMY, Mauricia Mertens M on: 09/18/2017 12:29 PM   Modules accepted: Orders

## 2017-09-28 ENCOUNTER — Ambulatory Visit (INDEPENDENT_AMBULATORY_CARE_PROVIDER_SITE_OTHER): Payer: Self-pay | Admitting: Family Medicine

## 2017-09-28 VITALS — BP 122/83 | HR 83 | Wt 181.1 lb

## 2017-09-28 DIAGNOSIS — O0990 Supervision of high risk pregnancy, unspecified, unspecified trimester: Secondary | ICD-10-CM

## 2017-09-28 DIAGNOSIS — Z113 Encounter for screening for infections with a predominantly sexual mode of transmission: Secondary | ICD-10-CM

## 2017-09-28 DIAGNOSIS — O24410 Gestational diabetes mellitus in pregnancy, diet controlled: Secondary | ICD-10-CM

## 2017-09-28 DIAGNOSIS — O09529 Supervision of elderly multigravida, unspecified trimester: Secondary | ICD-10-CM

## 2017-09-28 LAB — OB RESULTS CONSOLE GC/CHLAMYDIA: Gonorrhea: NEGATIVE

## 2017-09-28 LAB — OB RESULTS CONSOLE GBS: GBS: POSITIVE

## 2017-09-28 MED ORDER — CLOTRIMAZOLE 1 % EX CREA
1.0000 | TOPICAL_CREAM | Freq: Two times a day (BID) | CUTANEOUS | 0 refills | Status: DC
Start: 2017-09-28 — End: 2017-10-23

## 2017-09-28 NOTE — Addendum Note (Signed)
Addended by: Faythe Casa on: 09/28/2017 03:51 PM   Modules accepted: Orders

## 2017-09-28 NOTE — Progress Notes (Signed)
Spanish Interpreter Raquel Mora 

## 2017-09-28 NOTE — Progress Notes (Signed)
Subjective:  Rebecca Booker is a 37 y.o. G7P6006 at [redacted]w[redacted]d being seen today for ongoing prenatal care.  She is currently monitored for the following issues for this high-risk pregnancy and has Supervision of high risk pregnancy, antepartum; DM (diabetes mellitus), gestational; and AMA (advanced maternal age) multigravida 35+, unspecified trimester on their problem list.  GDM: Patient diet controlled.  Fasting: occasional elevated CBG to 100, but mostly controlled 2hr PP: controlled  Patient reports no complaints.  Contractions: Irritability. Vag. Bleeding: None.  Movement: Present. Denies leaking of fluid.   The following portions of the patient's history were reviewed and updated as appropriate: allergies, current medications, past family history, past medical history, past social history, past surgical history and problem list. Problem list updated.  Objective:   Vitals:   09/28/17 1346  BP: 122/83  Pulse: 83  Weight: 181 lb 1.6 oz (82.1 kg)    Fetal Status: Fetal Heart Rate (bpm): 140   Movement: Present  Presentation: Vertex  General:  Alert, oriented and cooperative. Patient is in no acute distress.  Skin: Skin is warm and dry. No rash noted.   Cardiovascular: Normal heart rate noted  Respiratory: Normal respiratory effort, no problems with respiration noted  Abdomen: Soft, gravid, appropriate for gestational age. Pain/Pressure: Present     Pelvic: Vag. Bleeding: None     Cervical exam deferred Dilation: Closed Effacement (%): Thick Station: Ballotable  Extremities: Normal range of motion.  Edema: Trace  Mental Status: Normal mood and affect. Normal behavior. Normal judgment and thought content.   Urinalysis:      Assessment and Plan:  Pregnancy: G7P6006 at [redacted]w[redacted]d  1. Supervision of high risk pregnancy, antepartum FHT and FH normal  2. AMA (advanced maternal age) multigravida 35+, unspecified trimester  3. Diet controlled gestational diabetes mellitus (GDM) in  third trimester Controlled. Continue testing  Preterm labor symptoms and general obstetric precautions including but not limited to vaginal bleeding, contractions, leaking of fluid and fetal movement were reviewed in detail with the patient. Please refer to After Visit Summary for other counseling recommendations.  Return in about 1 week (around 10/05/2017) for HR OB f/u.   Levie Heritage, DO

## 2017-10-02 LAB — CULTURE, BETA STREP (GROUP B ONLY): Strep Gp B Culture: POSITIVE — AB

## 2017-10-02 LAB — GC/CHLAMYDIA PROBE AMP (~~LOC~~) NOT AT ARMC
Chlamydia: NEGATIVE
NEISSERIA GONORRHEA: NEGATIVE

## 2017-10-09 ENCOUNTER — Encounter: Payer: Self-pay | Admitting: Obstetrics and Gynecology

## 2017-10-09 ENCOUNTER — Ambulatory Visit: Payer: Self-pay | Admitting: Obstetrics and Gynecology

## 2017-10-09 VITALS — BP 114/70 | HR 83 | Wt 181.9 lb

## 2017-10-09 DIAGNOSIS — O0990 Supervision of high risk pregnancy, unspecified, unspecified trimester: Secondary | ICD-10-CM

## 2017-10-09 DIAGNOSIS — Z029 Encounter for administrative examinations, unspecified: Secondary | ICD-10-CM

## 2017-10-09 DIAGNOSIS — Z789 Other specified health status: Secondary | ICD-10-CM | POA: Insufficient documentation

## 2017-10-09 DIAGNOSIS — O24410 Gestational diabetes mellitus in pregnancy, diet controlled: Secondary | ICD-10-CM

## 2017-10-09 DIAGNOSIS — Z758 Other problems related to medical facilities and other health care: Secondary | ICD-10-CM | POA: Insufficient documentation

## 2017-10-09 DIAGNOSIS — O99820 Streptococcus B carrier state complicating pregnancy: Secondary | ICD-10-CM

## 2017-10-09 NOTE — Progress Notes (Signed)
Prenatal Visit Note Date: 10/09/2017 Clinic: [facility]  Subjective:  Rebecca Booker is a 37 y.o. [id number] at [redacted]w[redacted]d being seen today for ongoing prenatal care.  She is currently monitored for the following issues for this high-risk pregnancy and has Supervision of high risk pregnancy, antepartum; GDM, class A1; AMA (advanced maternal age) multigravida 35+, unspecified trimester; and Language barrier on their problem list.  Patient reports no complaints.   Contractions: Irritability. Vag. Bleeding: None.  Movement: Present. Denies leaking of fluid.   The following portions of the patient's history were reviewed and updated as appropriate: allergies, current medications, past family history, past medical history, past social history, past surgical history and problem list. Problem list updated.  Objective:   Vitals:   10/09/17 1431  BP: 114/70  Pulse: 83  Weight: 181 lb 14.4 oz (82.5 kg)    Fetal Status: Fetal Heart Rate (bpm): 145   Movement: Present     General:  Alert, oriented and cooperative. Patient is in no acute distress.  Skin: Skin is warm and dry. No rash noted.   Cardiovascular: Normal heart rate noted  Respiratory: Normal respiratory effort, no problems with respiration noted  Abdomen: Soft, gravid, appropriate for gestational age. Pain/Pressure: Present     Pelvic:  Cervical exam deferred        Extremities: Normal range of motion.  Edema: Trace  Mental Status: Normal mood and affect. Normal behavior. Normal judgment and thought content.   Urinalysis:      Assessment and Plan:  Pregnancy: G7P6006 at [redacted]w[redacted]d  1. GDM, class A1 Normal BS log for the most part. She does have some am fastings and 2hr PP that are just above the normal range but she states she's been adding some honey with her tea. I told her to keep an eye on her diet so we can keep her a1 controlled. Has surveillance growth for next week  2. Supervision of high risk pregnancy,  antepartum No issues  3. Language barrier Interpreter used  Term labor symptoms and general obstetric precautions including but not limited to vaginal bleeding, contractions, leaking of fluid and fetal movement were reviewed in detail with the patient. Please refer to After Visit Summary for other counseling recommendations.  Return in about 1 week (around 10/16/2017).   Aneta Bing, MD

## 2017-10-16 ENCOUNTER — Ambulatory Visit (HOSPITAL_COMMUNITY): Payer: Self-pay

## 2017-10-18 ENCOUNTER — Ambulatory Visit (HOSPITAL_COMMUNITY)
Admission: RE | Admit: 2017-10-18 | Discharge: 2017-10-18 | Disposition: A | Discharge status: Home / Self Care | Payer: Self-pay | Source: Ambulatory Visit | Attending: Obstetrics and Gynecology | Admitting: Obstetrics and Gynecology

## 2017-10-18 ENCOUNTER — Other Ambulatory Visit: Payer: Self-pay | Admitting: Obstetrics and Gynecology

## 2017-10-18 ENCOUNTER — Ambulatory Visit (INDEPENDENT_AMBULATORY_CARE_PROVIDER_SITE_OTHER): Payer: Self-pay | Admitting: Obstetrics & Gynecology

## 2017-10-18 VITALS — BP 112/70 | HR 63 | Wt 178.0 lb

## 2017-10-18 DIAGNOSIS — O0990 Supervision of high risk pregnancy, unspecified, unspecified trimester: Secondary | ICD-10-CM

## 2017-10-18 DIAGNOSIS — Z3A38 38 weeks gestation of pregnancy: Secondary | ICD-10-CM

## 2017-10-18 DIAGNOSIS — O09293 Supervision of pregnancy with other poor reproductive or obstetric history, third trimester: Secondary | ICD-10-CM

## 2017-10-18 DIAGNOSIS — Z362 Encounter for other antenatal screening follow-up: Secondary | ICD-10-CM | POA: Insufficient documentation

## 2017-10-18 DIAGNOSIS — Z8632 Personal history of gestational diabetes: Secondary | ICD-10-CM

## 2017-10-18 DIAGNOSIS — O09523 Supervision of elderly multigravida, third trimester: Secondary | ICD-10-CM | POA: Insufficient documentation

## 2017-10-18 DIAGNOSIS — O09529 Supervision of elderly multigravida, unspecified trimester: Secondary | ICD-10-CM

## 2017-10-18 DIAGNOSIS — O99820 Streptococcus B carrier state complicating pregnancy: Secondary | ICD-10-CM

## 2017-10-18 DIAGNOSIS — O24410 Gestational diabetes mellitus in pregnancy, diet controlled: Secondary | ICD-10-CM

## 2017-10-18 LAB — POCT URINALYSIS DIP (DEVICE)
BILIRUBIN URINE: NEGATIVE
GLUCOSE, UA: NEGATIVE mg/dL
Hgb urine dipstick: NEGATIVE
Ketones, ur: NEGATIVE mg/dL
NITRITE: NEGATIVE
PH: 7 (ref 5.0–8.0)
PROTEIN: NEGATIVE mg/dL
Specific Gravity, Urine: 1.01 (ref 1.005–1.030)
Urobilinogen, UA: 0.2 mg/dL (ref 0.0–1.0)

## 2017-10-18 NOTE — Progress Notes (Signed)
PRENATAL VISIT NOTE  Subjective:  Rebecca Booker is a 37 y.o. G7P6006 at [redacted]w[redacted]d being seen today for ongoing prenatal care.  Patient is Spanish-speaking only, Spanish interpreter present for this encounter. She is currently monitored for the following issues for this high-risk pregnancy and has Supervision of high risk pregnancy, antepartum; GDM, class A1; AMA (advanced maternal age) multigravida 35+, unspecified trimester; Language barrier; and GBS (group B Streptococcus carrier), +RV culture, currently pregnant on their problem list.  Patient reports no complaints.  Contractions: Not present. Vag. Bleeding: None.  Movement: Present. Denies leaking of fluid.   The following portions of the patient's history were reviewed and updated as appropriate: allergies, current medications, past family history, past medical history, past social history, past surgical history and problem list. Problem list updated.  Objective:   Vitals:   10/18/17 1432  BP: 112/70  Pulse: 63  Weight: 178 lb (80.7 kg)    Fetal Status: Fetal Heart Rate (bpm): 140   Movement: Present     General:  Alert, oriented and cooperative. Patient is in no acute distress.  Skin: Skin is warm and dry. No rash noted.   Cardiovascular: Normal heart rate noted  Respiratory: Normal respiratory effort, no problems with respiration noted  Abdomen: Soft, gravid, appropriate for gestational age.  Pain/Pressure: Present     Pelvic: Cervical exam deferred        Extremities: Normal range of motion.  Edema: Trace  Mental Status: Normal mood and affect. Normal behavior. Normal judgment and thought content.   [place] Mfm Ob Follow Up  Result Date: 10/18/2017 ----------------------------------------------------------------------  OBSTETRICS REPORT                      (Signed Final 10/18/2017 01:42 pm) ---------------------------------------------------------------------- Patient Info  ID #:       [id number]                           D.O.B.:  10/03/1980 (37 yrs)  Name:       Rebecca Booker                      Visit Date: 10/18/2017 12:48 pm              Rebecca Booker ---------------------------------------------------------------------- Performed By  Performed By:     Lenise Arena        Secondary Phy.:   [facility] for                                                             [facility]  Healthcare  Attending:        Charlsie Merles MD         Address:          [facility]                                                             [address]                                                             [place], [place]                                                             [address]  Referred By:      Judeth Horn          Location:         [facility]                    CNM  Ref. Address:     [address] ---------------------------------------------------------------------- Orders   #  Description                                 Code   1  [place] MFM OB FOLLOW UP                         [id number]  ----------------------------------------------------------------------   #  Ordered By               Order #        Accession #    Episode #   1  Nettie Elm            [id number]      [id number]     [id number]  ---------------------------------------------------------------------- Indications   [redacted] weeks gestation of pregnancy                Z3A.38   Gestational diabetes in pregnancy, diet  O24.410   controlled   Poor obstetric history: Previous gestational   O91.299   diabetes   Advanced maternal age multigravida 53+,        O67.522   second trimester   Encounter for other antenatal screening        Z36.2   follow-up   ---------------------------------------------------------------------- OB History  Gravidity:    7         Term:   6  Living:       6 ---------------------------------------------------------------------- Fetal Evaluation  Num Of Fetuses:     1  Fetal Heart         133  Rate(bpm):  Cardiac Activity:   Observed  Presentation:       Cephalic  Placenta:           Anterior, above cervical os  P. Cord Insertion:  Previously Visualized  Amniotic Fluid  AFI FV:      Subjectively upper-normal  AFI Sum(cm)     %Tile       Largest Pocket(cm)  23.01           94          7.26  RUQ(cm)       RLQ(cm)       LUQ(cm)        LLQ(cm)  6.01          7.26          6.09           3.65 ---------------------------------------------------------------------- Biometry  BPD:      94.8  mm     G. Age:  38w 4d         81  %    CI:        80.38   %    70 - 86                                                          FL/HC:      22.0   %    20.9 - 22.7  HC:       334   mm     G. Age:  38w 1d         28  %    HC/AC:      0.96        0.92 - 1.05  AC:      347.9  mm     G. Age:  38w 5d         76  %    FL/BPD:     77.5   %    71 - 87  FL:       73.5  mm     G. Age:  37w 5d         34  %    FL/AC:      21.1   %    20 - 24  HUM:      59.4  mm     G. Age:  34w 3d        < 5  %  Est. FW:    3479  gm    7 lb 11 oz      80  % ---------------------------------------------------------------------- Gestational Age  LMP:  38w 3d        Date:  01/22/17                 EDD:   10/29/17  U/S Today:     38w 2d                                        EDD:   10/30/17  Best:          38w 3d     Det. By:  LMP  (01/22/17)          EDD:   10/29/17 ---------------------------------------------------------------------- Anatomy  Cranium:               Appears normal         Aortic Arch:            Previously seen  Cavum:                 Appears normal         Ductal Arch:            Previously seen  Ventricles:            Appears normal         Diaphragm:               Appears normal  Choroid Plexus:        Previously seen        Stomach:                Appears normal, left                                                                        sided  Cerebellum:            Previously seen        Abdomen:                Appears normal  Posterior Fossa:       Previously seen        Abdominal Wall:         Previously seen  Nuchal Fold:           Not applicable (>20    Cord Vessels:           Previously seen                         wks GA)  Face:                  Orbits prev seen;      Kidneys:                Appear normal                         lim profile normal  Lips:                  Previously seen        Bladder:  Appears normal  Thoracic:              Appears normal         Spine:                  Previously seen  Heart:                 Appears normal         Upper Extremities:      Previously seen                         (4CH, axis, and                         situs)  RVOT:                  Previously seen        Lower Extremities:      Previously seen  LVOT:                  Previously seen  Other:  Heels and 5th digit previously visualized. ---------------------------------------------------------------------- Cervix Uterus Adnexa  Cervix  Not visualized (advanced GA >29wks) ---------------------------------------------------------------------- Impression  Singleton intrauterine pregnancy at 38+3 weeks with GDM  here for growth  Interval review of the anatomy shows no sonographic  markers for aneuploidy or structural anomalies  All relevant fetal anatomy has been visualized  Amniotic fluid volume is normal  Estimated fetal weight shows growth in the 80th percentile ---------------------------------------------------------------------- Recommendations  Follow-up ultrasounds as clinically indicated. ----------------------------------------------------------------------                 Charlsie Merles, MD Electronically Signed Final Report   10/18/2017 01:42 pm  ----------------------------------------------------------------------   Assessment and Plan:  Pregnancy: Z6X0960 at [redacted]w[redacted]d  1. GDM, class A1 EFW 80% today. Normal CBGs.  IOL to be scheduled at 40 weeks; orders placed.  2. GBS (group B Streptococcus carrier), +RV culture, currently pregnant Needs intrapartum prophylaxis with PCN, patient aware.  3. AMA (advanced maternal age) multigravida 35+, unspecified trimester 4. Supervision of high risk pregnancy, antepartum Term labor symptoms and general obstetric precautions including but not limited to vaginal bleeding, contractions, leaking of fluid and fetal movement were reviewed in detail with the patient. Please refer to After Visit Summary for other counseling recommendations.  Return in about 1 week (around 10/25/2017) for OB Visit (HOB) then PP visit and 2 hr GTT in 8 weeks from now.   Jaynie Collins, MD

## 2017-10-21 ENCOUNTER — Inpatient Hospital Stay (HOSPITAL_COMMUNITY)
Admission: AD | Admit: 2017-10-21 | Discharge: 2017-10-23 | DRG: 807 | Disposition: A | Discharge status: Home / Self Care | Payer: Medicaid Other | Source: Ambulatory Visit | Attending: Obstetrics and Gynecology | Admitting: Obstetrics and Gynecology

## 2017-10-21 ENCOUNTER — Other Ambulatory Visit: Payer: Self-pay

## 2017-10-21 ENCOUNTER — Encounter (HOSPITAL_COMMUNITY): Payer: Self-pay

## 2017-10-21 DIAGNOSIS — Z3A38 38 weeks gestation of pregnancy: Secondary | ICD-10-CM

## 2017-10-21 DIAGNOSIS — O99824 Streptococcus B carrier state complicating childbirth: Secondary | ICD-10-CM | POA: Diagnosis not present

## 2017-10-21 DIAGNOSIS — O24420 Gestational diabetes mellitus in childbirth, diet controlled: Secondary | ICD-10-CM | POA: Diagnosis present

## 2017-10-21 DIAGNOSIS — O24419 Gestational diabetes mellitus in pregnancy, unspecified control: Secondary | ICD-10-CM | POA: Diagnosis not present

## 2017-10-21 LAB — CBC
HCT: 32.8 % — ABNORMAL LOW (ref 36.0–46.0)
Hemoglobin: 10.1 g/dL — ABNORMAL LOW (ref 12.0–15.0)
MCH: 24.5 pg — AB (ref 26.0–34.0)
MCHC: 30.8 g/dL (ref 30.0–36.0)
MCV: 79.4 fL (ref 78.0–100.0)
Platelets: 265 10*3/uL (ref 150–400)
RBC: 4.13 MIL/uL (ref 3.87–5.11)
RDW: 17.3 % — ABNORMAL HIGH (ref 11.5–15.5)
WBC: 12.5 10*3/uL — AB (ref 4.0–10.5)

## 2017-10-21 LAB — TYPE AND SCREEN
ABO/RH(D): O POS
Antibody Screen: NEGATIVE

## 2017-10-21 MED ORDER — LIDOCAINE HCL (PF) 1 % IJ SOLN
30.0000 mL | INTRAMUSCULAR | Status: DC | PRN
  Filled 2017-10-21: qty 30

## 2017-10-21 MED ORDER — ACETAMINOPHEN 325 MG PO TABS: 650.0000 mg | ORAL_TABLET | ORAL | Status: DC | PRN

## 2017-10-21 MED ORDER — FENTANYL CITRATE (PF) 100 MCG/2ML IJ SOLN
100.0000 ug | INTRAMUSCULAR | Status: DC | PRN
  Filled 2017-10-21: qty 2

## 2017-10-21 MED ORDER — SOD CITRATE-CITRIC ACID 500-334 MG/5ML PO SOLN: 30.0000 mL | ORAL | Status: DC | PRN

## 2017-10-21 MED ORDER — OXYCODONE-ACETAMINOPHEN 5-325 MG PO TABS: 2.0000 | ORAL_TABLET | ORAL | Status: DC | PRN

## 2017-10-21 MED ORDER — LACTATED RINGERS IV SOLN
INTRAVENOUS | Status: DC
  Administered 2017-10-21: 20:00:00 via INTRAVENOUS

## 2017-10-21 MED ORDER — ONDANSETRON HCL 4 MG/2ML IJ SOLN: 4.0000 mg | Freq: Four times a day (QID) | INTRAMUSCULAR | Status: DC | PRN

## 2017-10-21 MED ORDER — OXYTOCIN BOLUS FROM INFUSION
500.0000 mL | Freq: Once | INTRAVENOUS | Status: AC
  Administered 2017-10-21: 500 mL via INTRAVENOUS

## 2017-10-21 MED ORDER — LACTATED RINGERS IV SOLN: 500.0000 mL | INTRAVENOUS | Status: DC | PRN

## 2017-10-21 MED ORDER — OXYCODONE-ACETAMINOPHEN 5-325 MG PO TABS
1.0000 | ORAL_TABLET | ORAL | Status: DC | PRN
  Administered 2017-10-22: 1 via ORAL
  Filled 2017-10-21: qty 1

## 2017-10-21 MED ORDER — OXYTOCIN 40 UNITS IN LACTATED RINGERS INFUSION - SIMPLE MED
2.5000 [IU]/h | INTRAVENOUS | Status: DC
  Filled 2017-10-21: qty 1000

## 2017-10-21 MED ORDER — SODIUM CHLORIDE 0.9 % IV SOLN
2.0000 g | Freq: Once | INTRAVENOUS | Status: AC
  Administered 2017-10-21: 2 g via INTRAVENOUS
  Filled 2017-10-21: qty 2

## 2017-10-21 NOTE — Progress Notes (Addendum)
Labor Progress Note Kiyanna Biegler is a 37 y.o. G7P6006 at [redacted]w[redacted]d presented for active labor.  S: Patient laying comfortably in bed.  She denies feeling a lot of pressure.  Per RN, patient would not like epidural.  O:  BP 133/75   Pulse 87   Temp 98.4 F (36.9 C) (Oral)   Resp 16   Ht  (1.549 m)   Wt 181 lb 12.8 oz (82.5 kg)   LMP 01/22/2017   BMI 34.35 kg/m  EFM: 150bpm, moderate variability, reactive accelerations, absent decelerations  CVE: Dilation: 8.5 Effacement (%): 80 Station: Ballotable Presentation: Vertex Exam by:: Arne Cleveland, RN   A&P: 37 y.o. Z3Y8657 [redacted]w[redacted]d presented for SOL.  #Labor: Progressing well.  Expectant management.  Intact. Consider AROM but would like to give as much time as possible for adequate GBS ppx. #Pain: labor support without medications #FWB: Cat I #GBS positive. Ampicillin. #A1GDM: well controlled. No further management.  Alfonso Ellis, Medical Student 8:39 PM   RESIDENT ATTESTATION OF STUDENT NOTE   I have seen and examined this patient. I have discussed the findings and exam with the medical student and agree with the above note, which I have edited appropriately. I helped develop the management plan that is described in the student's note, and I agree with the content.   Leland Her, DO PGY-2  10/21/2017, 8:51 PM

## 2017-10-21 NOTE — H&P (Signed)
Obstetric History and Physical  Rebecca Booker is a 37 y.o. Z6X0960 with IUP at [redacted]w[redacted]d presenting for active labor. Patient states she has been having  Regular contractions, minimal vaginal bleeding, intact membranes, with active fetal movement. No blurry vision, headaches or peripheral edema, and RUQ pain.   Prenatal Course Source of Care: [facility] Dating: By LMP --->  Estimated Date of Delivery: 10/29/17 Pregnancy complications or risks: -A1GDM: well-controlled  Patient Active Problem List   Diagnosis Date Noted  . Language barrier 10/09/2017  . GBS (group B Streptococcus carrier), +RV culture, currently pregnant 10/09/2017  . Supervision of high risk pregnancy, antepartum 04/27/2017  . GDM, class A1 04/27/2017  . AMA (advanced maternal age) multigravida 35+, unspecified trimester 04/27/2017   She plans to breastfeed She desires Nexplanon for postpartum contraception.   Sono:    , CWD, normal anatomy, cephalic presentation, anterior placenta, 3479g, 80% EFW  Prenatal labs and studies: ABO, Rh: O/Positive/-- (10/29 0000) Antibody: Negative (10/29 0000) Rubella:  UNKNOWN RPR: Non Reactive (03/07 1424)  HBsAg:   UNKNOWN HIV: Non Reactive (03/07 1424)  AVW:UJWJXBJY (05/02 0000) 1 hr Glucola  92  Genetic screening declined Anatomy US normal  Prenatal Transfer Tool  Maternal Diabetes: Yes:  Diabetes Type:  Diet controlled Genetic Screening: Declined Maternal Ultrasounds/Referrals: Normal Fetal Ultrasounds or other Referrals:  None Maternal Substance Abuse:  No Significant Maternal Medications:  None Significant Maternal Lab Results: Lab values include: Group B Strep positive  Past Medical History:  Diagnosis Date  . GDM, class A2 04/27/2017  . Gestational diabetes   . Supervision of high risk pregnancy, antepartum 04/27/2017    Clinic  [facility] Prenatal Labs Dating  LMP Blood type: O/Positive/-- (10/29 0000)  Genetic Screen  Refused Antibody:Negative (10/29  0000) Anatomic [place]  normal Rubella:   GTT [facility] RPR: Non Reactive (03/07 1424)  Flu vaccine   HBsAg:    TDaP vaccine                                              HIV:  Negative  Baby Food  breast and bottle                                 No past surgical history on file.  OB History  Gravida Para Term Preterm AB Living  SAB TAB Ectopic Multiple Live Births          6    # Outcome Date GA Lbr Len/2nd Weight Sex Delivery Anes PTL Lv  7 Current           6 Term 02/24/16 [redacted]w[redacted]d  7 lb 3 oz (3.26 kg) F Vag-Spont None N LIV  5 Term 08/22/13 [redacted]w[redacted]d  5 lb 3 oz (2.353 kg) F Vag-Spont None N LIV  4 Term 04/29/07 [redacted]w[redacted]d  7 lb 3 oz (3.26 kg) M Vag-Spont None N LIV  3 Term 10/23/05 102w0d  7 lb 6 oz (3.345 kg) M Vag-Spont None N LIV  2 Term 09/22/03 [redacted]w[redacted]d  6 lb 12 oz (3.062 kg) M Vag-Spont None N LIV  1 Term 10/10/00 [redacted]w[redacted]d   M  None N LIV    Social History   Socioeconomic History  . Marital  status: Single    Spouse name: Not on file  . Number of children: Not on file  . Years of education: Not on file  . Highest education level: Not on file  Occupational History  . Not on file  Social Needs  . Financial resource strain: Not on file  . Food insecurity:    Worry: Not on file    Inability: Not on file  . Transportation needs:    Medical: Not on file    Non-medical: Not on file  Tobacco Use  . Smoking status: Never Smoker  . Smokeless tobacco: Never Used  Substance and Sexual Activity  . Alcohol use: No    Frequency: Never  . Drug use: No  . Sexual activity: Yes    Birth control/protection: None  Lifestyle  . Physical activity:    Days per week: Not on file    Minutes per session: Not on file  . Stress: Not on file  Relationships  . Social connections:    Talks on phone: Not on file    Gets together: Not on file    Attends religious service: Not on file    Active member of club or organization: Not on file    Attends meetings of clubs or  organizations: Not on file    Relationship status: Not on file  Other Topics Concern  . Not on file  Social History Narrative  . Not on file    No family history on file.  Medications Prior to Admission  Medication Sig Dispense Refill Last Dose  . calcium carbonate (TUMS - DOSED IN MG ELEMENTAL CALCIUM) 500 MG chewable tablet Chew 1 tablet by mouth daily as needed for indigestion or heartburn.   Taking  . clotrimazole (LOTRIMIN) 1 % cream Apply 1 application topically 2 (two) times daily. 30 g 0 Taking  . phenylephrine-shark liver oil-mineral oil-petrolatum (PREPARATION H) 0.25-14-74.9 % rectal ointment Place 1 application rectally 2 (two) times daily as needed for hemorrhoids. 28 g 3 Taking  . Prenatal Vit-Fe Fumarate-FA (PREPLUS) 27-1 MG TABS Take 1 tablet by mouth daily.   Taking    Allergies  Allergen Reactions  . Peanut-Containing Drug Products Other (See Comments)    Gets an irritated throat and starts coughing    Review of Systems: Negative except for what is mentioned in HPI.  Physical Exam: BP 127/77   Pulse (!) 109   Temp 98.6 F (37 C) (Oral)   Resp 18   Ht  (1.549 m)   Wt 181 lb 12.8 oz (82.5 kg)   LMP 01/22/2017   BMI 34.35 kg/m  CONSTITUTIONAL: Well-developed, well-nourished female in no acute distress.  HENT:  Normocephalic, atraumatic, External right and left ear normal. Oropharynx is clear and moist EYES: Conjunctivae and EOM are normal. Pupils are equal, round, and reactive to light. No scleral icterus.  NECK: Normal range of motion, supple, no masses SKIN: Skin is warm and dry. No rash noted. Not diaphoretic. No erythema. No pallor. NEUROLOGIC: Alert and oriented to person, place, and time. Normal reflexes, muscle tone coordination. No cranial nerve deficit noted. PSYCHIATRIC: Normal mood and affect. Normal behavior. Normal judgment and thought content. CARDIOVASCULAR: Normal heart rate noted, regular rhythm RESPIRATORY: Effort and breath sounds  normal, no problems with respiration noted ABDOMEN: Soft, nontender, nondistended, gravid. MUSCULOSKELETAL: Normal range of motion. No edema and no tenderness. 2+ distal pulses.  Cervical Exam: Dilation: 6 Effacement (%): 80 Station: Ballotable Presentation: Vertex Exam by:: DR [employer]  FHT:  Baseline rate 145 bpm   Variability moderate  Accelerations present   Decelerations none Contractions: Every 5-7 mins   Pertinent Labs/Studies:   No results found for this or any previous visit (from the past 24 hour(s)).  Assessment : Melady Chow is a 37 y.o. G7P6006 at [redacted]w[redacted]d being admitted for labor.  Plan: Labor: Expectant management. Analgesia as needed. FWB: Reassuring fetal heart tracing.   GBS positive - will give dose of Ampicillin Delivery plan: Hopeful for vaginal delivery   Caryl Ada, DO OB Fellow Faculty Practice, [facility] -  10/21/2017, 6:58 PM

## 2017-10-21 NOTE — Anesthesia Pain Management Evaluation Note (Signed)
  CRNA Pain Management Visit Note  Patient: Rebecca Booker, 37 y.o., female  "Hello I am a member of the anesthesia team at [facility]. We have an anesthesia team available at all times to provide care throughout the hospital, including epidural management and anesthesia for C-section. I don't know your plan for the delivery whether it a natural birth, water birth, IV sedation, nitrous supplementation, doula or epidural, but we want to meet your pain goals."   1.Was your pain managed to your expectations on prior hospitalizations?   Yes   2.What is your expectation for pain management during this hospitalization?     Labor support without medications  3.How can we help you reach that goal? Pt speaks limited english but expreses that she does not want an epidural  Record the patient's initial score and the patient's pain goal.   Pain: 8  Pain Goal: 10 [facility] wants you to be able to say your pain was always managed very well.  Sita Mangen 10/21/2017

## 2017-10-22 ENCOUNTER — Encounter (HOSPITAL_COMMUNITY): Payer: Self-pay | Admitting: General Practice

## 2017-10-22 LAB — ABO/RH: ABO/RH(D): O POS

## 2017-10-22 LAB — GLUCOSE, CAPILLARY: GLUCOSE-CAPILLARY: 86 mg/dL (ref 65–99)

## 2017-10-22 LAB — HEPATITIS B SURFACE ANTIGEN: Hepatitis B Surface Ag: NEGATIVE

## 2017-10-22 LAB — RPR: RPR Ser Ql: NONREACTIVE

## 2017-10-22 MED ORDER — ACETAMINOPHEN 325 MG PO TABS: 650.0000 mg | ORAL_TABLET | ORAL | Status: DC | PRN

## 2017-10-22 MED ORDER — ZOLPIDEM TARTRATE 5 MG PO TABS: 5.0000 mg | ORAL_TABLET | Freq: Every evening | ORAL | Status: DC | PRN

## 2017-10-22 MED ORDER — PRENATAL MULTIVITAMIN CH
1.0000 | ORAL_TABLET | Freq: Every day | ORAL | Status: DC
  Administered 2017-10-22 – 2017-10-23 (×2): 1 via ORAL
  Filled 2017-10-22 (×2): qty 1

## 2017-10-22 MED ORDER — TETANUS-DIPHTH-ACELL PERTUSSIS 5-2.5-18.5 LF-MCG/0.5 IM SUSP: 0.5000 mL | Freq: Once | INTRAMUSCULAR | Status: DC

## 2017-10-22 MED ORDER — DIBUCAINE 1 % RE OINT: 1.0000 "application " | TOPICAL_OINTMENT | RECTAL | Status: DC | PRN

## 2017-10-22 MED ORDER — DIPHENHYDRAMINE HCL 25 MG PO CAPS: 25.0000 mg | ORAL_CAPSULE | Freq: Four times a day (QID) | ORAL | Status: DC | PRN

## 2017-10-22 MED ORDER — ONDANSETRON HCL 4 MG/2ML IJ SOLN: 4.0000 mg | INTRAMUSCULAR | Status: DC | PRN

## 2017-10-22 MED ORDER — COCONUT OIL OIL
1.0000 | TOPICAL_OIL | Status: DC | PRN
Start: 2017-10-22 — End: 2017-10-23

## 2017-10-22 MED ORDER — WITCH HAZEL-GLYCERIN EX PADS: 1.0000 "application " | MEDICATED_PAD | CUTANEOUS | Status: DC | PRN

## 2017-10-22 MED ORDER — OXYCODONE HCL 5 MG PO TABS: 5.0000 mg | ORAL_TABLET | ORAL | Status: DC | PRN

## 2017-10-22 MED ORDER — ONDANSETRON HCL 4 MG PO TABS: 4.0000 mg | ORAL_TABLET | ORAL | Status: DC | PRN

## 2017-10-22 MED ORDER — OXYCODONE HCL 5 MG PO TABS: 10.0000 mg | ORAL_TABLET | ORAL | Status: DC | PRN

## 2017-10-22 MED ORDER — SIMETHICONE 80 MG PO CHEW: 80.0000 mg | CHEWABLE_TABLET | ORAL | Status: DC | PRN

## 2017-10-22 MED ORDER — IBUPROFEN 600 MG PO TABS
600.0000 mg | ORAL_TABLET | Freq: Four times a day (QID) | ORAL | Status: DC
  Administered 2017-10-22 – 2017-10-23 (×6): 600 mg via ORAL
  Filled 2017-10-22 (×6): qty 1

## 2017-10-22 MED ORDER — SENNOSIDES-DOCUSATE SODIUM 8.6-50 MG PO TABS
2.0000 | ORAL_TABLET | ORAL | Status: DC
  Administered 2017-10-22: 2 via ORAL
  Filled 2017-10-22: qty 2

## 2017-10-22 MED ORDER — BENZOCAINE-MENTHOL 20-0.5 % EX AERO: 1.0000 "application " | INHALATION_SPRAY | CUTANEOUS | Status: DC | PRN

## 2017-10-22 NOTE — Progress Notes (Addendum)
POSTPARTUM PROGRESS NOTE  Post Partum Day 1  Subjective:  Rebecca Booker is a 37 y.o. Z6X0960 s/p SVD at [redacted]w[redacted]d.  She reports she is doing well. No acute events overnight. She denies any problems with ambulating, voiding or po intake. Denies nausea or vomiting.  Patient denies HA, vision changes, RUQ pain.  She has cramping that has been relieved with medication.  Pain is well controlled.  Lochia is nml.  She denies any increase in bleeding.  Spanish video interpreter was used for communication with patient.  Objective: Blood pressure 107/69, pulse 79, temperature 98.6 F (37 C), temperature source Oral, resp. rate 16, height  (1.549 m), weight 82.5 kg (181 lb 12.8 oz), last menstrual period 01/22/2017, SpO2 97 %, unknown if currently breastfeeding.  Physical Exam:  General: alert, cooperative and no distress Chest: no respiratory distress Heart:regular rate, distal pulses intact Abdomen: soft, nontender,  Uterine Fundus: firm, appropriately tender DVT Evaluation: No calf swelling or tenderness Extremities: no edema Skin: warm, dry  Recent Labs    10/21/17 1900  HGB 10.1*  HCT 32.8*    Assessment/Plan: Rebecca Booker is a 37 y.o. A5W0981 s/p SVD at [redacted]w[redacted]d   PPD#1 - Doing well, BP stable   Routine postpartum care  Contraception: Nexplanon Feeding: Breast & Bottle Feeding Dispo: Plan for discharge 10/23/2017.   LOS: 1 day   Alfonso Ellis Medical Student 10/22/2017, 6:41 AM

## 2017-10-22 NOTE — Lactation Note (Signed)
This note was copied from a baby's chart. Lactation Consultation Note  Patient Name: Rebecca Booker [id number] Date: 10/22/2017 Reason for consult: Initial assessment;Early term 37-38.6wks In house Spanish interpreter here for visit.  Mom states she breastfed previous babies without difficulty.  She reports baby is latching well and feedings are comfortable.  Instructed to feed with feeding cues and call for assist prn.  Breastfeeding consultation services and support information given to patient.  Maternal Data    Feeding Feeding Type: Breast Fed Length of feed: 15 min  LATCH Score                   Interventions    Lactation Tools Discussed/Used     Consult Status Consult Status: Follow-up Date: 10/23/17 Follow-up type: In-patient    [name] 10/22/2017, 2:03 PM

## 2017-10-22 NOTE — Progress Notes (Signed)
Pt using significant other to translate. Pt offer hospital translator and refused.

## 2017-10-23 DIAGNOSIS — O24419 Gestational diabetes mellitus in pregnancy, unspecified control: Secondary | ICD-10-CM

## 2017-10-23 DIAGNOSIS — Z3A38 38 weeks gestation of pregnancy: Secondary | ICD-10-CM

## 2017-10-23 DIAGNOSIS — O99824 Streptococcus B carrier state complicating childbirth: Secondary | ICD-10-CM

## 2017-10-23 MED ORDER — IBUPROFEN 600 MG PO TABS: 600.0000 mg | ORAL_TABLET | Freq: Four times a day (QID) | ORAL | 0 refills | Status: AC

## 2017-10-23 NOTE — Discharge Summary (Addendum)
OB Discharge Summary     Patient Name: Rebecca Booker DOB: 1980/11/22 MRN: [id number]  Date of admission: 10/21/2017 Delivering MD: Pincus Large   Date of discharge: 10/23/2017  Admitting diagnosis: 39 WKS, PAIN, CTXS Intrauterine pregnancy: [redacted]w[redacted]d     Secondary diagnosis:  Active Problems:   Normal labor   SVD (spontaneous vaginal delivery)  Additional problems: GDMA1, well controlled     Discharge diagnosis: Term Pregnancy Delivered                                                                                                Post partum procedures:none  Augmentation: none  Complications: None  Hospital course:  Onset of Labor With Vaginal Delivery     37 y.o. yo [id number] at [redacted]w[redacted]d was admitted in Active Labor on 10/21/2017. Patient had an uncomplicated labor course as follows:  Membrane Rupture Time/Date: 11:28 PM ,10/21/2017   Intrapartum Procedures: Episiotomy: None [1]                                         Lacerations:  None [1]  Patient had a delivery of a Viable infant. 10/21/2017  Information for the patient's newborn:  Keren Alverio, Girl Keshana [[id number]]  Delivery Method: Vag-Spont    Pateint had an uncomplicated postpartum course.  She is ambulating, tolerating a regular diet, passing flatus, and urinating well. Patient is discharged home in stable condition on 10/23/17.   Physical exam  Vitals:   10/22/17 0200 10/22/17 0550 10/22/17 1831 10/23/17 0515  BP: 104/69 107/69 110/73 109/70  Pulse: 79 79 65 62  Resp: Temp: 98.3 F (36.8 C) 98.6 F (37 C) 98.3 F (36.8 C) 98.2 F (36.8 C)  TempSrc: Oral Oral Oral Oral  SpO2: 98% 97%    Weight:      Height:       General: alert, cooperative and no distress Lochia: appropriate Uterine Fundus: firm Incision: N/A DVT Evaluation: No evidence of DVT seen on physical exam. Negative Homan's sign. No cords or calf tenderness. No significant calf/ankle edema. Labs: Lab Results   Component Value Date   WBC 12.5 (H) 10/21/2017   HGB 10.1 (L) 10/21/2017   HCT 32.8 (L) 10/21/2017   MCV 79.4 10/21/2017   PLT 265 10/21/2017   No flowsheet data found.  Discharge instruction: per After Visit Summary and "Baby and Me Booklet".  After visit meds:  Allergies as of 10/23/2017      Reactions   Peanut-containing Drug Products Other (See Comments)   Gets an irritated throat and starts coughing      Medication List    STOP taking these medications   calcium carbonate 500 MG chewable tablet Commonly known as:  TUMS - dosed in mg elemental calcium   clotrimazole 1 % cream Commonly known as:  LOTRIMIN   phenylephrine-shark liver oil-mineral oil-petrolatum 0.25-14-74.9 % rectal ointment Commonly known as:  PREPARATION H     TAKE these medications  ibuprofen 600 MG tablet Commonly known as:  ADVIL,MOTRIN Take 1 tablet (600 mg total) by mouth every 6 (six) hours.   PREPLUS 27-1 MG Tabs Take 1 tablet by mouth daily.       Diet: routine diet  Activity: Advance as tolerated. Pelvic rest for 6 weeks.   Outpatient follow up:2 weeks Follow up Appt: Future Appointments  Date Time Provider Department Center  10/25/2017  3:15 PM Hermina Staggers, MD [facility]   Follow up Visit:No follow-ups on file.  Postpartum contraception: Nexplanon  Newborn Data: Live born female  Birth Weight: 6 lb 5.4 oz (2875 g) APGAR: 9, 9  Newborn Delivery   Birth date/time:  10/21/2017 23:29:00 Delivery type:  Vaginal, Spontaneous     Baby Feeding: Bottle and Breast Disposition:home with mother   10/23/2017 Leland Her, DO PGY-2  OB FELLOW DISCHARGE ATTESTATION  I have seen and examined this patient and agree with above documentation in the resident's note.   Frederik Pear, MD OB Fellow

## 2017-10-23 NOTE — Progress Notes (Signed)
Patient wishes to order her own food. Eda H Royal Interpreter. °

## 2017-10-23 NOTE — Discharge Instructions (Signed)
Vaginal Delivery, Care After °Refer to this sheet in the next few weeks. These instructions provide you with information about caring for yourself after vaginal delivery. Your health care provider may also give you more specific instructions. Your treatment has been planned according to current medical practices, but problems sometimes occur. Call your health care provider if you have any problems or questions. °What can I expect after the procedure? °After vaginal delivery, it is common to have: °· Some bleeding from your vagina. °· Soreness in your abdomen, your vagina, and the area of skin between your vaginal opening and your anus (perineum). °· Pelvic cramps. °· Fatigue. ° °Follow these instructions at home: °Medicines °· Take over-the-counter and prescription medicines only as told by your health care provider. °· If you were prescribed an antibiotic medicine, take it as told by your health care provider. Do not stop taking the antibiotic until it is finished. °Driving ° °· Do not drive or operate heavy machinery while taking prescription pain medicine. °· Do not drive for 24 hours if you received a sedative. °Lifestyle °· Do not drink alcohol. This is especially important if you are breastfeeding or taking medicine to relieve pain. °· Do not use tobacco products, including cigarettes, chewing tobacco, or e-cigarettes. If you need help quitting, ask your health care provider. °Eating and drinking °· Drink at least 8 eight-ounce glasses of water every day unless you are told not to by your health care provider. If you choose to breastfeed your baby, you may need to drink more water than this. °· Eat high-fiber foods every day. These foods may help prevent or relieve constipation. High-fiber foods include: °? Whole grain cereals and breads. °? Brown rice. °? Beans. °? Fresh fruits and vegetables. °Activity °· Return to your normal activities as told by your health care provider. Ask your health care provider  what activities are safe for you. °· Rest as much as possible. Try to rest or take a nap when your baby is sleeping. °· Do not lift anything that is heavier than your baby or 10 lb (4.5 kg) until your health care provider says that it is safe. °· Talk with your health care provider about when you can engage in sexual activity. This may depend on your: °? Risk of infection. °? Rate of healing. °? Comfort and desire to engage in sexual activity. °Vaginal Care °· If you have an episiotomy or a vaginal tear, check the area every day for signs of infection. Check for: °? More redness, swelling, or pain. °? More fluid or blood. °? Warmth. °? Pus or a bad smell. °· Do not use tampons or douches until your health care provider says this is safe. °· Watch for any blood clots that may pass from your vagina. These may look like clumps of dark red, brown, or black discharge. °General instructions °· Keep your perineum clean and dry as told by your health care provider. °· Wear loose, comfortable clothing. °· Wipe from front to back when you use the toilet. °· Ask your health care provider if you can shower or take a bath. If you had an episiotomy or a perineal tear during labor and delivery, your health care provider may tell you not to take baths for a certain length of time. °· Wear a bra that supports your breasts and fits you well. °· If possible, have someone help you with household activities and help care for your baby for at least a few days after   you leave the hospital. °· Keep all follow-up visits for you and your baby as told by your health care provider. This is important. °Contact a health care provider if: °· You have: °? Vaginal discharge that has a bad smell. °? Difficulty urinating. °? Pain when urinating. °? A sudden increase or decrease in the frequency of your bowel movements. °? More redness, swelling, or pain around your episiotomy or vaginal tear. °? More fluid or blood coming from your episiotomy or  vaginal tear. °? Pus or a bad smell coming from your episiotomy or vaginal tear. °? A fever. °? A rash. °? Little or no interest in activities you used to enjoy. °? Questions about caring for yourself or your baby. °· Your episiotomy or vaginal tear feels warm to the touch. °· Your episiotomy or vaginal tear is separating or does not appear to be healing. °· Your breasts are painful, hard, or turn red. °· You feel unusually sad or worried. °· You feel nauseous or you vomit. °· You pass large blood clots from your vagina. If you pass a blood clot from your vagina, save it to show to your health care provider. Do not flush blood clots down the toilet without having your health care provider look at them. °· You urinate more than usual. °· You are dizzy or light-headed. °· You have not breastfed at all and you have not had a menstrual period for 12 weeks after delivery. °· You have stopped breastfeeding and you have not had a menstrual period for 12 weeks after you stopped breastfeeding. °Get help right away if: °· You have: °? Pain that does not go away or does not get better with medicine. °? Chest pain. °? Difficulty breathing. °? Blurred vision or spots in your vision. °? Thoughts about hurting yourself or your baby. °· You develop pain in your abdomen or in one of your legs. °· You develop a severe headache. °· You faint. °· You bleed from your vagina so much that you fill two sanitary pads in one hour. °This information is not intended to replace advice given to you by your health care provider. Make sure you discuss any questions you have with your health care provider. °Document Released: 05/13/2000 Document Revised: 10/28/2015 Document Reviewed: 05/31/2015 °[employer]vier Interactive Patient Education © 2018 [employer] ° °

## 2017-10-24 ENCOUNTER — Encounter: Payer: Self-pay | Admitting: *Deleted

## 2017-10-25 ENCOUNTER — Encounter: Payer: Self-pay | Admitting: Obstetrics and Gynecology

## 2017-10-29 ENCOUNTER — Inpatient Hospital Stay (HOSPITAL_COMMUNITY): Admission: RE | Admit: 2017-10-29 | Payer: Self-pay | Source: Ambulatory Visit

## 2017-11-01 ENCOUNTER — Encounter: Payer: Self-pay | Admitting: Obstetrics and Gynecology

## 2017-11-27 ENCOUNTER — Encounter: Payer: Self-pay | Admitting: Obstetrics and Gynecology

## 2017-11-27 ENCOUNTER — Ambulatory Visit (INDEPENDENT_AMBULATORY_CARE_PROVIDER_SITE_OTHER): Payer: Self-pay | Admitting: Obstetrics and Gynecology

## 2017-11-27 MED ORDER — NORETHINDRONE 0.35 MG PO TABS: 1.0000 | ORAL_TABLET | Freq: Every day | ORAL | 11 refills | Status: AC

## 2017-11-27 NOTE — Progress Notes (Signed)
Subjective:     Josefine Classna Guzman [name] is a 37 y.o. female who presents for a postpartum visit. She is 5 weeks 2 days postpartum following a spontaneous vaginal delivery. I have fully reviewed the prenatal and intrapartum course. The delivery was at 39.5 gestational weeks. Outcome: spontaneous vaginal delivery. Anesthesia: none. Postpartum course has been uncomplicated. Baby's course has been uncomplicated Baby is feeding by breast. Bleeding no bleeding. Bowel function is normal. Bladder function is normal. Patient is sexually active. Contraception method is condoms and oral progesterone-only contraceptive. Postpartum depression screening: negative.  The following portions of the patient's history were reviewed and updated as appropriate: allergies, current medications, past family history, past medical history, past social history, past surgical history and problem list.  Review of Systems Pertinent items are noted in HPI.   Objective:    BP 114/67   Pulse 69   Wt 168 lb (76.2 kg)   Breastfeeding? Yes   BMI 31.74 kg/m   General:  alert, cooperative and no distress   Breasts:  negative  Lungs: normal work of breathing  Heart:  regular rate and rhythm, pulses intact  Abdomen: soft, non-tender; bowel sounds normal; no masses,  no organomegaly   Pelvic:  not evaluated  MSK: normal ROM, no edema  Skin:  warm adn dry  Psych: normal mood and affect        Assessment:    Healthy postpartum exam. Pap smear not done at today's visit.   Plan:    1. Contraception: oral progesterone-only contraceptive. Counseled on proper use. 2. Pap: completed [id number] and was normal 3. Follow up in: 1 year or as needed.    Caryl AdaJazma Phelps, DO [facility]

## 2017-11-27 NOTE — Patient Instructions (Signed)

## 2017-12-08 ENCOUNTER — Other Ambulatory Visit: Payer: Self-pay

## 2017-12-11 ENCOUNTER — Other Ambulatory Visit: Payer: Self-pay | Admitting: *Deleted

## 2017-12-11 DIAGNOSIS — Z8632 Personal history of gestational diabetes: Secondary | ICD-10-CM

## 2017-12-15 ENCOUNTER — Other Ambulatory Visit: Payer: Self-pay

## 2020-02-03 IMAGING — US US MFM OB FOLLOW-UP
1 series · 14 of 28 positions shown · non-contrast
Comparison: none

[Series 1: us mfm ob follow-up · 35 acquisitions, 14 frames shown]
[im 2/35]
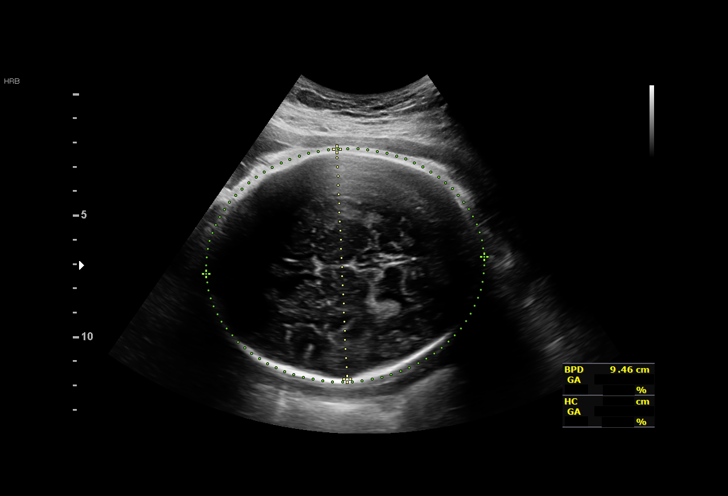
[im 4/35]
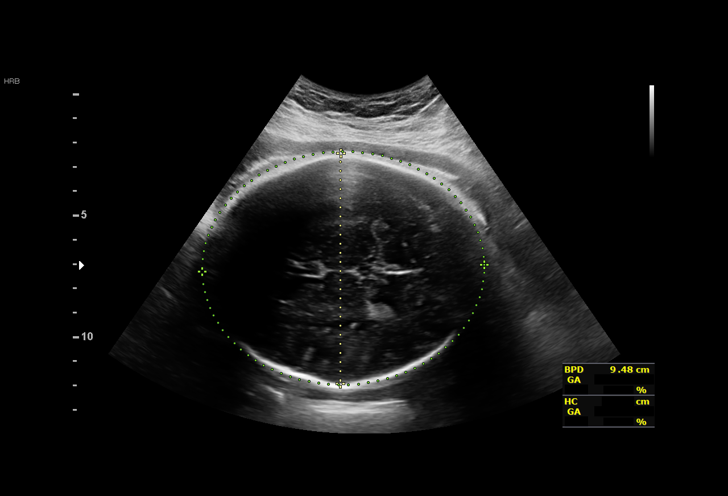
[im 7/35]
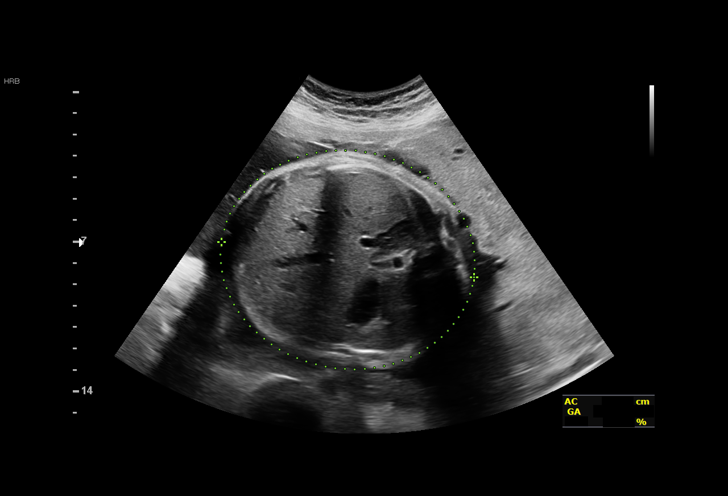
[im 9/35]
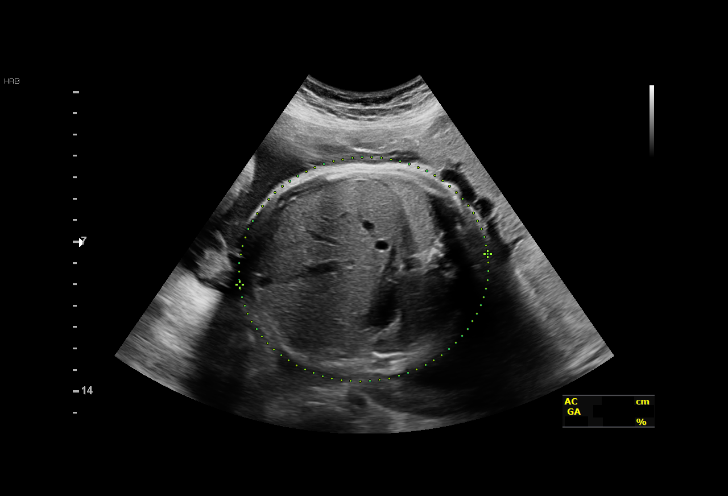
[im 12/35]
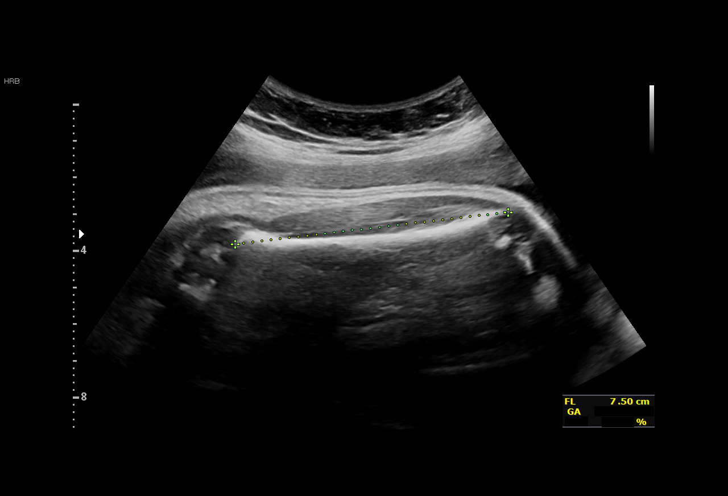
[im 14/35]
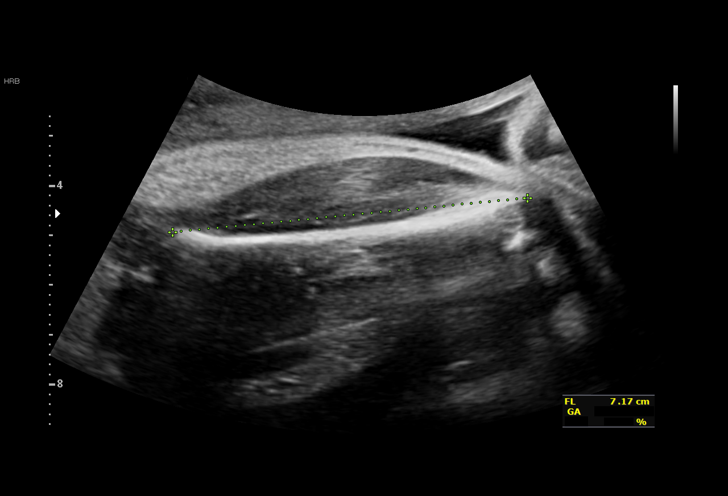
[im 17/35]
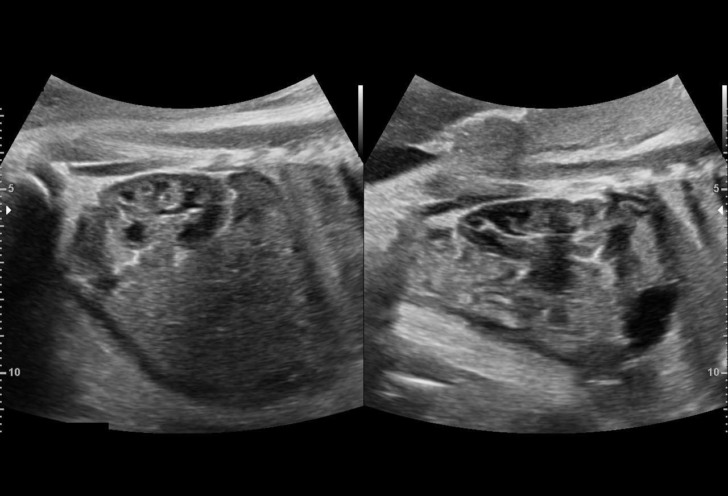
[im 19/35]
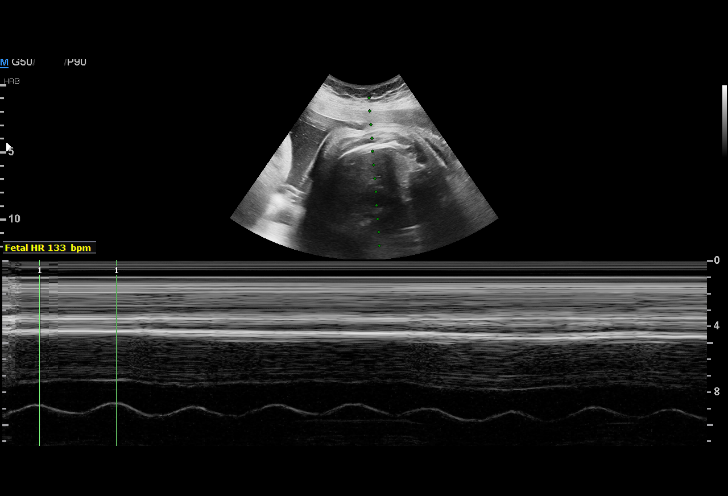
[im 22/35]
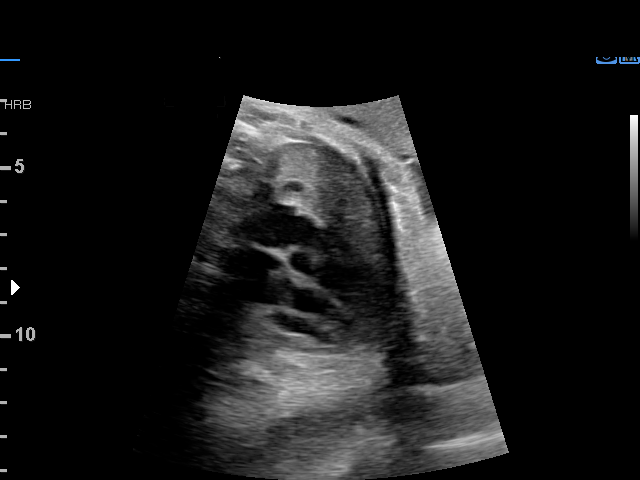
[im 24/35]
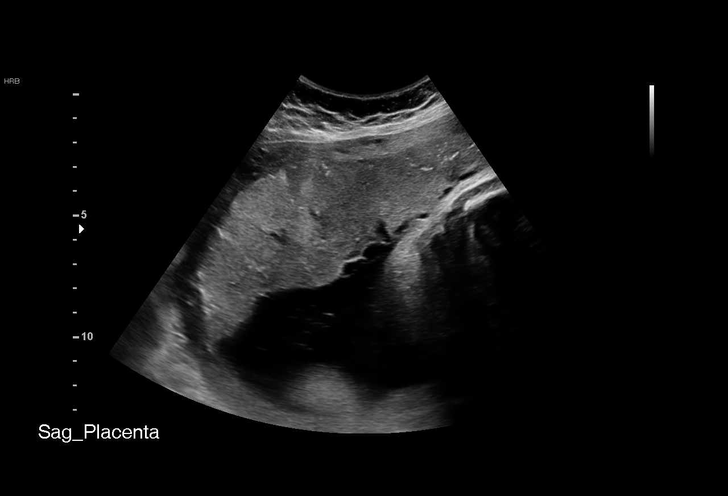
[im 27/35]
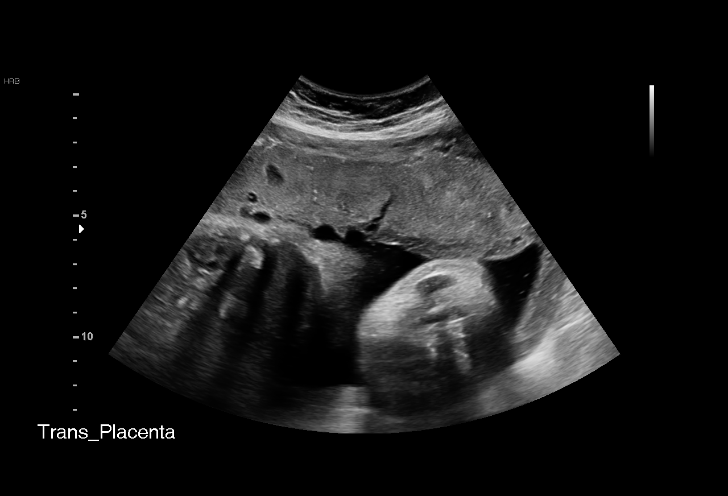
[im 29/35]
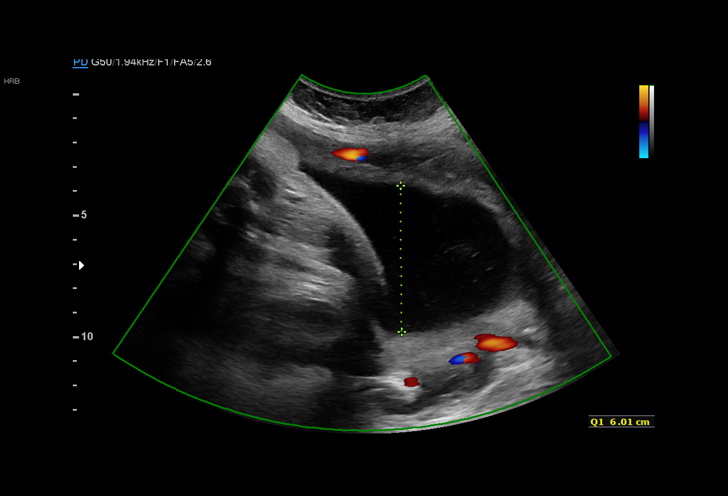
[im 32/35]
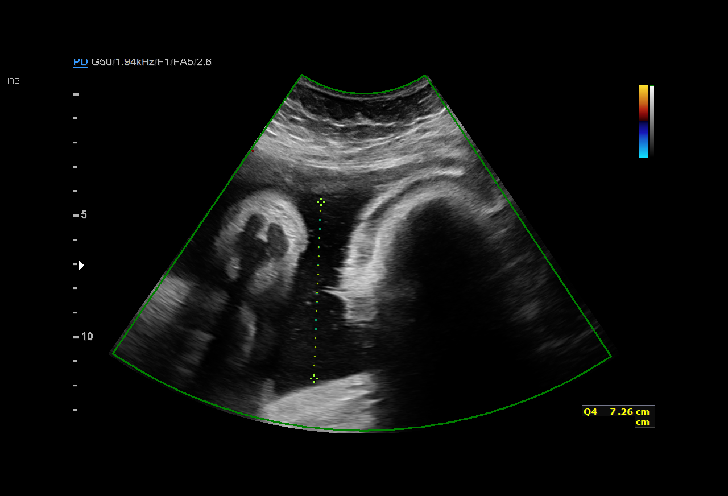
[im 35/35]
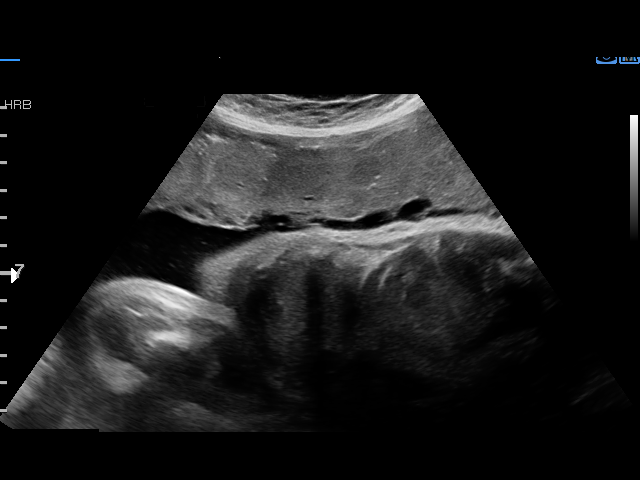

[14 of 28 positions shown; findings below may reference images not displayed]

[facility]

[facility]
[REDACTED]

1  DILIA KUN            [id number]      [id number]     [id number]
Indications

38 weeks gestation of pregnancy
Gestational diabetes in pregnancy, diet
controlled
Poor obstetric history: Previous gestational
diabetes
Advanced maternal age multigravida 35+,
second trimester
Encounter for other antenatal screening
follow-up
OB History

Gravidity:    7         Term:   6
Living:       6
Fetal Evaluation

Num Of Fetuses:     1
Fetal Heart         133
Rate(bpm):
Cardiac Activity:   Observed
Presentation:       Cephalic
Placenta:           Anterior, above cervical os
P. Cord Insertion:  Previously Visualized
Amniotic Fluid
AFI FV:      Subjectively upper-normal

AFI Sum(cm)     %Tile       Largest Pocket(cm)
23.01           94

RUQ(cm)       RLQ(cm)       LUQ(cm)        LLQ(cm)
6.01
Biometry

BPD:      94.8  mm     G. Age:  38w 4d         81  %    CI:        80.38   %    70 - 86
FL/HC:      22.0   %    20.9 -
HC:       334   mm     G. Age:  38w 1d         28  %    HC/AC:      0.96        0.92 -
AC:      347.9  mm     G. Age:  38w 5d         76  %    FL/BPD:     77.5   %    71 - 87
FL:       73.5  mm     G. Age:  37w 5d         34  %    FL/AC:      21.1   %    20 - 24
HUM:      59.4  mm     G. Age:  34w 3d        < 5  %

Est. FW:    6166  gm    7 lb 11 oz      80  %
Gestational Age

LMP:           38w 3d        Date:  01/22/17                 EDD:   10/29/17
U/S Today:     38w 2d                                        EDD:   10/30/17
Best:          38w 3d     Det. By:  LMP  (01/22/17)          EDD:   10/29/17
Anatomy

Cranium:               Appears normal         Aortic Arch:            Previously seen
Cavum:                 Appears normal         Ductal Arch:            Previously seen
Ventricles:            Appears normal         Diaphragm:              Appears normal
Choroid Plexus:        Previously seen        Stomach:                Appears normal, left
sided
Cerebellum:            Previously seen        Abdomen:                Appears normal
Posterior Fossa:       Previously seen        Abdominal Wall:         Previously seen
Nuchal Fold:           Not applicable (>20    Cord Vessels:           Previously seen
wks GA)
Face:                  Orbits prev seen;      Kidneys:                Appear normal
Horcky profile normal
Lips:                  Previously seen        Bladder:                Appears normal
Thoracic:              Appears normal         Spine:                  Previously seen
Heart:                 Appears normal         Upper Extremities:      Previously seen
(4CH, axis, and
situs)
RVOT:                  Previously seen        Lower Extremities:      Previously seen
LVOT:                  Previously seen

Other:  Heels and 5th digit previously visualized.
Cervix Uterus Adnexa

Cervix
Not visualized (advanced GA >49wks)
Impression

Singleton intrauterine pregnancy at 38+3 weeks with GDM
here for growth
Interval review of the anatomy shows no sonographic
markers for aneuploidy or structural anomalies
All relevant fetal anatomy has been visualized
Amniotic fluid volume is normal
Estimated fetal weight shows growth in the 80th percentile
Recommendations

Follow-up ultrasounds as clinically indicated.
